# Patient Record
Sex: Female | Born: 1935 | Race: White | Hispanic: No | Smoking: Former smoker
Health system: Southern US, Community
[De-identification: ages and names within clinical notes are randomized; demographics above are authoritative.]

## PROBLEM LIST (undated history)

## (undated) DIAGNOSIS — J8410 Pulmonary fibrosis, unspecified: Secondary | ICD-10-CM

## (undated) DIAGNOSIS — H3530 Unspecified macular degeneration: Secondary | ICD-10-CM

## (undated) DIAGNOSIS — C679 Malignant neoplasm of bladder, unspecified: Secondary | ICD-10-CM

## (undated) DIAGNOSIS — E785 Hyperlipidemia, unspecified: Secondary | ICD-10-CM

## (undated) DIAGNOSIS — I700 Atherosclerosis of aorta: Secondary | ICD-10-CM

## (undated) DIAGNOSIS — E039 Hypothyroidism, unspecified: Secondary | ICD-10-CM

## (undated) DIAGNOSIS — I82B19 Acute embolism and thrombosis of unspecified subclavian vein: Secondary | ICD-10-CM

## (undated) DIAGNOSIS — I739 Peripheral vascular disease, unspecified: Secondary | ICD-10-CM

## (undated) DIAGNOSIS — H548 Legal blindness, as defined in USA: Secondary | ICD-10-CM

## (undated) DIAGNOSIS — R52 Pain, unspecified: Secondary | ICD-10-CM

## (undated) DIAGNOSIS — K5730 Diverticulosis of large intestine without perforation or abscess without bleeding: Secondary | ICD-10-CM

## (undated) HISTORY — DX: Acute embolism and thrombosis of unspecified subclavian vein: I82.B19

## (undated) HISTORY — DX: Unspecified macular degeneration: H35.30

## (undated) HISTORY — DX: Hyperlipidemia, unspecified: E78.5

## (undated) HISTORY — DX: Hypothyroidism, unspecified: E03.9

## (undated) HISTORY — DX: Peripheral vascular disease, unspecified: I73.9

## (undated) HISTORY — PX: BLADDER SURGERY: SHX569

## (undated) HISTORY — DX: Legal blindness, as defined in USA: H54.8

## (undated) HISTORY — DX: Malignant neoplasm of bladder, unspecified: C67.9

## (undated) HISTORY — DX: Atherosclerosis of aorta: I70.0

## (undated) HISTORY — DX: Diverticulosis of large intestine without perforation or abscess without bleeding: K57.30

## (undated) HISTORY — PX: CATARACT EXTRACTION: SUR2

## (undated) HISTORY — PX: TONSILLECTOMY: SUR1361

## (undated) HISTORY — DX: Pulmonary fibrosis, unspecified: J84.10

---

## 1994-05-19 DIAGNOSIS — K5730 Diverticulosis of large intestine without perforation or abscess without bleeding: Secondary | ICD-10-CM

## 1994-05-19 HISTORY — DX: Diverticulosis of large intestine without perforation or abscess without bleeding: K57.30

## 1997-05-19 DIAGNOSIS — I82B19 Acute embolism and thrombosis of unspecified subclavian vein: Secondary | ICD-10-CM

## 1997-05-19 HISTORY — DX: Acute embolism and thrombosis of unspecified subclavian vein: I82.B19

## 1998-03-16 ENCOUNTER — Encounter: Payer: Self-pay | Admitting: Internal Medicine

## 1998-03-16 ENCOUNTER — Ambulatory Visit (HOSPITAL_COMMUNITY): Admission: RE | Admit: 1998-03-16 | Discharge: 1998-03-16 | Payer: Self-pay | Admitting: Internal Medicine

## 1998-03-26 ENCOUNTER — Ambulatory Visit (HOSPITAL_BASED_OUTPATIENT_CLINIC_OR_DEPARTMENT_OTHER): Admission: RE | Admit: 1998-03-26 | Discharge: 1998-03-26 | Payer: Self-pay | Admitting: Urology

## 1998-05-17 ENCOUNTER — Ambulatory Visit: Admission: RE | Admit: 1998-05-17 | Discharge: 1998-05-17 | Payer: Self-pay | Admitting: Cardiovascular Disease

## 1998-05-17 ENCOUNTER — Encounter: Payer: Self-pay | Admitting: Cardiovascular Disease

## 1998-10-17 ENCOUNTER — Ambulatory Visit (HOSPITAL_BASED_OUTPATIENT_CLINIC_OR_DEPARTMENT_OTHER): Admission: RE | Admit: 1998-10-17 | Discharge: 1998-10-17 | Payer: Self-pay | Admitting: Urology

## 2005-04-22 ENCOUNTER — Ambulatory Visit: Payer: Self-pay | Admitting: Family Medicine

## 2006-06-16 ENCOUNTER — Ambulatory Visit: Payer: Self-pay | Admitting: Family Medicine

## 2007-06-29 ENCOUNTER — Ambulatory Visit: Payer: Self-pay | Admitting: Internal Medicine

## 2007-06-29 DIAGNOSIS — K5730 Diverticulosis of large intestine without perforation or abscess without bleeding: Secondary | ICD-10-CM | POA: Insufficient documentation

## 2007-06-29 DIAGNOSIS — F4321 Adjustment disorder with depressed mood: Secondary | ICD-10-CM

## 2007-06-29 DIAGNOSIS — I739 Peripheral vascular disease, unspecified: Secondary | ICD-10-CM

## 2007-06-29 DIAGNOSIS — E039 Hypothyroidism, unspecified: Secondary | ICD-10-CM

## 2007-06-29 DIAGNOSIS — M810 Age-related osteoporosis without current pathological fracture: Secondary | ICD-10-CM | POA: Insufficient documentation

## 2007-06-30 LAB — CONVERTED CEMR LAB
Free T4: 1.1 ng/dL (ref 0.6–1.6)
TSH: 0.54 microintl units/mL (ref 0.35–5.50)

## 2007-09-28 ENCOUNTER — Ambulatory Visit: Payer: Self-pay | Admitting: Internal Medicine

## 2007-12-28 ENCOUNTER — Ambulatory Visit: Payer: Self-pay | Admitting: Internal Medicine

## 2008-10-31 ENCOUNTER — Encounter: Payer: Self-pay | Admitting: Internal Medicine

## 2008-12-05 ENCOUNTER — Ambulatory Visit: Payer: Self-pay | Admitting: Internal Medicine

## 2008-12-07 LAB — CONVERTED CEMR LAB
Albumin: 3.7 g/dL (ref 3.5–5.2)
BUN: 15 mg/dL (ref 6–23)
Basophils Absolute: 0.1 10*3/uL (ref 0.0–0.1)
Basophils Relative: 0.9 % (ref 0.0–3.0)
CO2: 30 meq/L (ref 19–32)
Calcium: 9.8 mg/dL (ref 8.4–10.5)
Chloride: 107 meq/L (ref 96–112)
Creatinine, Ser: 0.8 mg/dL (ref 0.4–1.2)
Eosinophils Absolute: 0.2 10*3/uL (ref 0.0–0.7)
Eosinophils Relative: 3.1 % (ref 0.0–5.0)
Free T4: 1.2 ng/dL (ref 0.6–1.6)
Glucose, Bld: 92 mg/dL (ref 70–99)
HCT: 42.7 % (ref 36.0–46.0)
Hemoglobin: 14.8 g/dL (ref 12.0–15.0)
Lymphocytes Relative: 24.3 % (ref 12.0–46.0)
Lymphs Abs: 1.5 10*3/uL (ref 0.7–4.0)
MCHC: 34.6 g/dL (ref 30.0–36.0)
MCV: 91.9 fL (ref 78.0–100.0)
Monocytes Absolute: 0.6 10*3/uL (ref 0.1–1.0)
Monocytes Relative: 9 % (ref 3.0–12.0)
Neutro Abs: 3.9 10*3/uL (ref 1.4–7.7)
Neutrophils Relative %: 62.7 % (ref 43.0–77.0)
Phosphorus: 3.9 mg/dL (ref 2.3–4.6)
Platelets: 177 10*3/uL (ref 150.0–400.0)
Potassium: 4.9 meq/L (ref 3.5–5.1)
RBC: 4.65 M/uL (ref 3.87–5.11)
RDW: 13.7 % (ref 11.5–14.6)
Sodium: 142 meq/L (ref 135–145)
TSH: 0.78 microintl units/mL (ref 0.35–5.50)
WBC: 6.3 10*3/uL (ref 4.5–10.5)

## 2009-10-30 ENCOUNTER — Encounter: Payer: Self-pay | Admitting: Internal Medicine

## 2009-12-04 ENCOUNTER — Ambulatory Visit: Payer: Self-pay | Admitting: Internal Medicine

## 2009-12-06 LAB — CONVERTED CEMR LAB
Albumin: 3.9 g/dL (ref 3.5–5.2)
BUN: 15 mg/dL (ref 6–23)
Basophils Absolute: 0.1 10*3/uL (ref 0.0–0.1)
Basophils Relative: 0.9 % (ref 0.0–3.0)
CO2: 33 meq/L — ABNORMAL HIGH (ref 19–32)
Calcium: 9.4 mg/dL (ref 8.4–10.5)
Chloride: 105 meq/L (ref 96–112)
Creatinine, Ser: 0.8 mg/dL (ref 0.4–1.2)
Eosinophils Absolute: 0.2 10*3/uL (ref 0.0–0.7)
Eosinophils Relative: 2.4 % (ref 0.0–5.0)
Free T4: 1.04 ng/dL (ref 0.60–1.60)
GFR calc non Af Amer: 74.41 mL/min (ref >60)
Glucose, Bld: 88 mg/dL (ref 70–99)
HCT: 42.3 % (ref 36.0–46.0)
Hemoglobin: 14.4 g/dL (ref 12.0–15.0)
Lymphocytes Relative: 26.1 % (ref 12.0–46.0)
Lymphs Abs: 1.8 10*3/uL (ref 0.7–4.0)
MCHC: 34.1 g/dL (ref 30.0–36.0)
MCV: 92.8 fL (ref 78.0–100.0)
Monocytes Absolute: 0.6 10*3/uL (ref 0.1–1.0)
Monocytes Relative: 8.9 % (ref 3.0–12.0)
Neutro Abs: 4.3 10*3/uL (ref 1.4–7.7)
Neutrophils Relative %: 61.7 % (ref 43.0–77.0)
Phosphorus: 3.4 mg/dL (ref 2.3–4.6)
Platelets: 168 10*3/uL (ref 150.0–400.0)
Potassium: 4.4 meq/L (ref 3.5–5.1)
RBC: 4.55 M/uL (ref 3.87–5.11)
RDW: 14.4 % (ref 11.5–14.6)
Sodium: 142 meq/L (ref 135–145)
TSH: 0.9 microintl units/mL (ref 0.35–5.50)
WBC: 6.9 10*3/uL (ref 4.5–10.5)

## 2010-06-18 NOTE — Letter (Signed)
Summary: [facility]   [facility]   Imported By: Lanelle Bal 11/03/2009 09:36:36  _____________________________________________________________________  External Attachment:    Type:   Image     Comment:   External Document  Appended Document: [facility]  normal cystoscopy no evidence of recurrent bladder tumor

## 2010-06-18 NOTE — Assessment & Plan Note (Signed)
Summary: YEARLY F/U/BIR   Vital Signs:  Patient profile:   77 year old female Height:      63.5 inches Weight:      181 pounds BMI:     31.67 Temp:     98.0 degrees F oral Pulse rate:   64 / minute Pulse rhythm:   regular BP sitting:   112 / 78  (left arm) Cuff size:   large  Vitals Entered By: Sydell Axon LPN (December 04, 2009 10:05 AM) CC: Yearly follow-up  Vision Screening:Left eye w/o correction: 20 / 400 Right Eye w/o correction: 20 / 400        20db HL: Left  500 hz: 25db 1000 hz: 25db 2000 hz: 25db 4000 hz: 25db Right  500 hz: 25db 1000 hz: No Response 2000 hz: 25db 4000 hz: 25db    History of Present Illness: Here for Medicare AWV:  1.   Risk factors based on Past M, S, F history:               No sig medical risk factors               Still prefers to stay isolated for the most part, though living in the [facility] community                2.   Physical Activities:                   Walks about 30 minutes daily for 5 days per week  3.   Depression/mood:                Chronic dysthymia since losing husband and other famiily members over the ast few years               some degree of anhedonia but stable             doesn't feel meds would be appropriate               Generally satisfied with things as they are  4.   Hearing:                  fairly normal  5.   ADL's:                Completely independent with ADLs               Gets Zenaida Niece to grocery store or husband's niece drives her                Cooks, cleans herself 6.   Fall Risk:                Elevated due to vision problems              Balance and strength are good            Home environment as safe as possible after review  7.   Home Safety:              As noted, has standard features for the [facility]               no clutter, etc  8.   Height, weight, &visual acuity:              has gained 5# in past year              tries to eat right and exercise so  no concerns with mild  increased BMI              plans to cut back some---slightly uncomfortable with her current weight             Has macular degeneration and is legally blind             Uses walker to help out of bed and to get her stable when putting on shoes             Apt  set up with safety in mind---standard grab bars, etc in bathroom, no rugs (has carpeting), no               significant clutter   Rest under ROS     Preventive Screening-Counseling & Management  Alcohol-Tobacco     Alcohol drinks/day: 0     Smoking Status: quit > 6 months     Smoking Cessation Counseling: no  Caffeine-Diet-Exercise     Diet Comments: Lots of vegetable and fruit     Diet Counseling: not indicated; diet is assessed to be healthy     Does Patient Exercise: yes     Type of exercise: walks most days     Exercise (avg: min/session): 30-60     Times/week: 5     Exercise Counseling: not indicated; exercise is adequate     Depression Counseling: not indicated; screening negative for depression  Allergies: No Known Drug Allergies  Past History:  Past medical, surgical, family and social histories (including risk factors) reviewed for relevance to current acute and chronic problems.  Past Medical History: Reviewed history from 12/05/2008 and no changes required. Hyperlipidemia Hypothyroidism Osteoporosis Bladder cancer-------Dr Stoioff Diverticulosis, colon---on sig 1996 Peripheral vascular disease Macular degeneration--legally blind  Past Surgical History: Reviewed history from 06/29/2007 and no changes required. 1999/2000/2003  Bladder cancer removed--[facility], then [facility] 1999  Left subclavian obstruction---no Rx----- Dr Alanda Amass Cataracts  Family History: Reviewed history from 06/29/2007 and no changes required. Dad died @87 --didn't really know of what Mom died @84  had enlarged heart 8 siblings--only 1 sister remains DM strong in the family 1 brother died of MI at 31 1 brother died of DM  complications 1 brother died of bladder cancer No HTN No breast or colon cancer  Social History: Reviewed history from 06/29/2007 and no changes required. Widowed 11/08 No children Doesn't drive Former Smoker Alcohol use-no  Has living will. Has DNR form already--wants this continued Requests husband's niece [facility] or Nettie Elm wouldn't want feeding tube Smoking Status:  quit > 6 months Does Patient Exercise:  yes  Review of Systems  The patient denies chest pain, syncope, dyspnea on exertion, abdominal pain, melena, and hematochezia.         9.   Counseling:               discussed mood---no further eval or Rx indicated              Reviewed healthy diet--she generally does well with this             Recommended regular physical activity---at least some walking  10.   Labs ordered based on risk factors:                  Thyroid, glucose mainly  11.           Referral Coordination                none needed  12.           Care Plan                  UTD on immunizations                 has yearly ophtho appts                 Cancer screening not appropriate at her age  49.            Cognitive Assessment                 no cognitive problems   She has no concerns Still only on the thyroid medication, aspirin and citracal weight up 5# sleeps okay but not great Frequent nocturia--often every 2 hours  Physical Exam  General:  alert and normal appearance.   Neck:  supple, no masses, no thyromegaly, no carotid bruits, and no cervical lymphadenopathy.   Lungs:  normal respiratory effort and normal breath sounds.   Heart:  normal rate, regular rhythm, no murmur, and no gallop.   Msk:  no joint tenderness and no joint swelling.   Neurologic:  needs slight assist getting up on table no focal weakness normal tone gait normal Psych:  Oriented X3, memory intact for recent and remote, normally interactive, good eye contact, not anxious appearing, and not  depressed appearing.     Impression & Recommendations:  Problem # 1:  PREVENTIVE HEALTH CARE (ICD-V70.0) Assessment Comment Only  annual wellness visit done dysthymia which is stable and chronic vision loss are main issues Lives in [facility] so has resources should her needs go up  Orders: First annual wellness visit with prevention plan  [phone])  Problem # 2:  HYPOTHYROIDISM (ICD-244.9) Assessment: Comment Only  due for labs no apparent problems  Her updated medication list for this problem includes:    Synthroid 125 Mcg Tabs (Levothyroxine sodium) .Marland Kitchen... Take 1 tablet by mouth once a day  Orders: TLB-TSH (Thyroid Stimulating Hormone) (84443-TSH) TLB-T4 (Thyrox), Free [phone]) TLB-CBC Platelet - w/Differential (85025-CBCD) TLB-Renal Function Panel (80069-RENAL) Venipuncture (65784)  Problem # 3:  OSTEOPOROSIS (ICD-733.00) Assessment: Comment Only walks daily on calcium and vitamin D  Her updated medication list for this problem includes:    Citracal/vitamin D 250-200 Mg-unit Tabs (Calcium citrate-vitamin d) .Marland Kitchen... Take one by mouth two times a day  Complete Medication List: 1)  Synthroid 125 Mcg Tabs (Levothyroxine sodium) .... Take 1 tablet by mouth once a day 2)  Aspirin 81 Mg Tbec (Aspirin) .Marland Kitchen.. 1 daily 3)  Citracal/vitamin D 250-200 Mg-unit Tabs (Calcium citrate-vitamin d) .... Take one by mouth two times a day  Patient Instructions: 1)  Please schedule a follow-up appointment in 1 year.   Current Allergies (reviewed today): No known allergies

## 2010-11-30 ENCOUNTER — Encounter: Payer: Self-pay | Admitting: Internal Medicine

## 2010-12-03 ENCOUNTER — Ambulatory Visit (INDEPENDENT_AMBULATORY_CARE_PROVIDER_SITE_OTHER): Payer: Medicare Other | Admitting: Internal Medicine

## 2010-12-03 ENCOUNTER — Encounter: Payer: Self-pay | Admitting: Internal Medicine

## 2010-12-03 DIAGNOSIS — E039 Hypothyroidism, unspecified: Secondary | ICD-10-CM

## 2010-12-03 DIAGNOSIS — F4321 Adjustment disorder with depressed mood: Secondary | ICD-10-CM

## 2010-12-03 DIAGNOSIS — M810 Age-related osteoporosis without current pathological fracture: Secondary | ICD-10-CM

## 2010-12-03 LAB — HEPATIC FUNCTION PANEL
Alkaline Phosphatase: 76 U/L (ref 39–117)
Bilirubin, Direct: 0.1 mg/dL (ref 0.0–0.3)
Total Protein: 7.4 g/dL (ref 6.0–8.3)

## 2010-12-03 LAB — CBC WITH DIFFERENTIAL/PLATELET
Eosinophils Relative: 2.9 % (ref 0.0–5.0)
HCT: 43.4 % (ref 36.0–46.0)
Hemoglobin: 14.8 g/dL (ref 12.0–15.0)
Lymphocytes Relative: 27 % (ref 12.0–46.0)
MCHC: 34.2 g/dL (ref 30.0–36.0)
MCV: 91.3 fl (ref 78.0–100.0)
Monocytes Absolute: 0.6 10*3/uL (ref 0.1–1.0)
Monocytes Relative: 8.5 % (ref 3.0–12.0)
RBC: 4.75 Mil/uL (ref 3.87–5.11)
RDW: 14 % (ref 11.5–14.6)

## 2010-12-03 LAB — BASIC METABOLIC PANEL
CO2: 28 mEq/L (ref 19–32)
GFR: 69.1 mL/min (ref >60.00)
Glucose, Bld: 108 mg/dL — ABNORMAL HIGH (ref 70–99)
Potassium: 4.5 mEq/L (ref 3.5–5.1)
Sodium: 141 mEq/L (ref 135–145)

## 2010-12-03 NOTE — Assessment & Plan Note (Signed)
Walks regularly Good dietary intake recommmended vitamin D but she prefers not to take any more meds

## 2010-12-03 NOTE — Assessment & Plan Note (Signed)
Chronic melancholy still Keeps herself socially isolated--worsened by her blindness No overt depression though Rx not appropriate

## 2010-12-03 NOTE — Progress Notes (Signed)
Subjective:    Patient ID: Tracy Matthews, female    DOB: 08-10-1921, 77 y.o.   MRN: [id number]  HPI Doing well No new concerns  Still feels lonesome but is used to it Goes to chapel every Sunday Goes to [employer] weekly with [employer] bus to get groceries Doesn't eat out at all--cooks herself  Discussed vitamin D She is reluctant to take any meds Has fair amount of dietary calcium Does try to walk daily and uses exercise bike at home  No sores in feet Some cramps in left foot in past from past fracture Gets toenails trimmed at podiatrist  Still with blindness---just sees shapes, etc Is able to cook, etc with her limited vision  Current Outpatient Prescriptions on File Prior to Visit  Medication Sig Dispense Refill  . aspirin 81 MG tablet Take 81 mg by mouth daily.        Marland Kitchen levothyroxine (SYNTHROID, LEVOTHROID) 125 MCG tablet Take 125 mcg by mouth daily.          No Known Allergies  Past Medical History  Diagnosis Date  . Hyperlipidemia   . Osteoporosis   . Hyperthyroidism   . Bladder cancer     Dr Lonna Cobb  . Diverticulosis of colon 1996    on sig  . PVD (peripheral vascular disease)   . Macular degeneration     legally blind  . Subclavian vein obstruction 1999    Left, no Rx, Dr Alanda Amass    Past Surgical History  Procedure Date  . Bladder surgery [phone]    bladder cancer removed- [facility], then [facility]  . Cataract extraction     Family History  Problem Relation Age of Onset  . Other Mother     enlarged heart  . Diabetes      strong in family  . Heart attack Brother 37  . Diabetes Brother   . Cancer Brother     bladder ca  . Hypertension Neg Hx   . Breast cancer Neg Hx   . Colon cancer Neg Hx     History   Social History  . Marital Status: Married    Spouse Name: N/A    Number of Children: 0  . Years of Education: N/A   Occupational History  . Not on file.   Social History Main Topics  . Smoking status: Former Games developer  .  Smokeless tobacco: Never Used  . Alcohol Use: No  . Drug Use: Not on file  . Sexually Active: Not on file   Other Topics Concern  . Not on file   Social History Narrative   8 siblings, only 1 sister remainsWidowed 11/08No childrenDoesn't driveHas living will. Has DNR form already- wants this continued. Request husbands niece [facility] or Nettie Elm wouldn't want feeding tube   Review of Systems Sleeps okay--but is awake a lot. Not tired during the day Frequent nocturia Weight is stable    Objective:   Physical Exam  Constitutional: She appears well-developed and well-nourished. No distress.  Neck: Normal range of motion. Neck supple. No thyromegaly present.  Cardiovascular: Normal rate, regular rhythm and normal heart sounds.  Exam reveals no gallop.   No murmur heard.      Faint distal pulses  Pulmonary/Chest: Effort normal and breath sounds normal. No respiratory distress. She has no wheezes. She has no rales.  Abdominal: Soft. She exhibits no mass. There is no tenderness.  Musculoskeletal: Normal range of motion. She exhibits no edema and no tenderness.  Lymphadenopathy:  She has no cervical adenopathy.  Psychiatric: She has a normal mood and affect. Her behavior is normal. Judgment and thought content normal.          Assessment & Plan:

## 2010-12-03 NOTE — Assessment & Plan Note (Signed)
Seems to be euthyroid Will check labs 

## 2010-12-10 ENCOUNTER — Encounter: Payer: Self-pay | Admitting: *Deleted

## 2011-04-11 ENCOUNTER — Other Ambulatory Visit: Payer: Self-pay | Admitting: Internal Medicine

## 2011-07-22 DIAGNOSIS — B351 Tinea unguium: Secondary | ICD-10-CM | POA: Diagnosis not present

## 2011-07-22 DIAGNOSIS — M79609 Pain in unspecified limb: Secondary | ICD-10-CM | POA: Diagnosis not present

## 2011-10-21 DIAGNOSIS — B351 Tinea unguium: Secondary | ICD-10-CM | POA: Diagnosis not present

## 2011-10-21 DIAGNOSIS — M79609 Pain in unspecified limb: Secondary | ICD-10-CM | POA: Diagnosis not present

## 2011-10-28 DIAGNOSIS — Z8551 Personal history of malignant neoplasm of bladder: Secondary | ICD-10-CM | POA: Diagnosis not present

## 2011-12-09 ENCOUNTER — Ambulatory Visit: Payer: Medicare Other | Admitting: Internal Medicine

## 2011-12-15 ENCOUNTER — Ambulatory Visit: Payer: Medicare Other | Admitting: Internal Medicine

## 2011-12-16 ENCOUNTER — Encounter: Payer: Self-pay | Admitting: Internal Medicine

## 2011-12-16 ENCOUNTER — Ambulatory Visit (INDEPENDENT_AMBULATORY_CARE_PROVIDER_SITE_OTHER): Payer: Medicare Other | Admitting: Internal Medicine

## 2011-12-16 VITALS — BP 142/84 | HR 76 | Temp 97.9°F | Ht 63.5 in | Wt 171.5 lb

## 2011-12-16 DIAGNOSIS — H548 Legal blindness, as defined in USA: Secondary | ICD-10-CM

## 2011-12-16 DIAGNOSIS — E039 Hypothyroidism, unspecified: Secondary | ICD-10-CM | POA: Diagnosis not present

## 2011-12-16 DIAGNOSIS — M810 Age-related osteoporosis without current pathological fracture: Secondary | ICD-10-CM

## 2011-12-16 LAB — BASIC METABOLIC PANEL
BUN: 13 mg/dL (ref 6–23)
CO2: 29 mEq/L (ref 19–32)
Chloride: 103 mEq/L (ref 96–112)
GFR: 74.06 mL/min (ref >60.00)
Glucose, Bld: 95 mg/dL (ref 70–99)
Potassium: 4.7 mEq/L (ref 3.5–5.1)

## 2011-12-16 LAB — CBC WITH DIFFERENTIAL/PLATELET
Basophils Absolute: 0 10*3/uL (ref 0.0–0.1)
Eosinophils Absolute: 0.2 10*3/uL (ref 0.0–0.7)
HCT: 43 % (ref 36.0–46.0)
Lymphs Abs: 1.6 10*3/uL (ref 0.7–4.0)
MCHC: 33.5 g/dL (ref 30.0–36.0)
MCV: 91.2 fl (ref 78.0–100.0)
Monocytes Absolute: 0.7 10*3/uL (ref 0.1–1.0)
Platelets: 160 10*3/uL (ref 150.0–400.0)
RDW: 14.3 % (ref 11.5–14.6)

## 2011-12-16 LAB — HEPATIC FUNCTION PANEL
Albumin: 3.8 g/dL (ref 3.5–5.2)
Total Protein: 6.6 g/dL (ref 6.0–8.3)

## 2011-12-16 LAB — TSH: TSH: 1.06 u[IU]/mL (ref 0.35–5.50)

## 2011-12-16 NOTE — Progress Notes (Signed)
Subjective:    Patient ID: Tracy Matthews, female    DOB: 09-11-1921, 78 y.o.   MRN: [id number]  HPI Doing well Here for check up No new concerns  Still lives independently Goes [place] grocery store [place] bus Sees enough to shop and cook herself Independent with all cleaning and cooking Prefers to stay by herself---doesn't go to activities at [facility]  Still on thyroid med Bowels fine Skin feels somewhat bumpy---no sig dry or flaky skin Notes dry mouth--uses biotene  Gets dietary calcium Still prefers not to take vitamin d  Not overtly depressed but isolated  Walks daily Some degree of anhedonia  Current Outpatient Prescriptions on File Prior to Visit  Medication Sig Dispense Refill  . aspirin 81 MG tablet Take 81 mg by mouth daily.        Marland Kitchen SYNTHROID 125 MCG tablet TAKE 1 TABLET EVERY DAY  30 tablet  6    No Known Allergies  Past Medical History  Diagnosis Date  . Hyperlipidemia   . Osteoporosis   . Bladder cancer     Dr Lonna Cobb  . Diverticulosis of colon 1996    on sig  . PVD (peripheral vascular disease)   . Macular degeneration     legally blind  . Subclavian vein obstruction 1999    Left, no Rx, Dr Alanda Amass  . Unspecified hypothyroidism     Past Surgical History  Procedure Date  . Bladder surgery [phone]    bladder cancer removed- [facility], then [facility]  . Cataract extraction     Family History  Problem Relation Age of Onset  . Other Mother     enlarged heart  . Diabetes      strong in family  . Heart attack Brother 37  . Diabetes Brother   . Cancer Brother     bladder ca  . Hypertension Neg Hx   . Breast cancer Neg Hx   . Colon cancer Neg Hx     History   Social History  . Marital Status: Widowed    Spouse Name: N/A    Number of Children: 0  . Years of Education: N/A   Occupational History  . Not on file.   Social History Main Topics  . Smoking status: Former Games developer  . Smokeless tobacco: Never Used  . Alcohol  Use: No  . Drug Use: No  . Sexually Active: Not on file   Other Topics Concern  . Not on file   Social History Narrative   8 siblings, only 1 sister remainsWidowed 11/08No childrenDoesn't driveHas living will. Has DNR form already- wants this continued. Request husbands niece [facility] or Nettie Elm wouldn't want feeding tube   Review of Systems Appetite is fine Weight stable Sleep is variable--gets bored and will be up at times    Objective:   Physical Exam  Constitutional: She appears well-developed and well-nourished. No distress.  Neck: Normal range of motion. Neck supple. No thyromegaly present.  Cardiovascular: Normal rate, regular rhythm and normal heart sounds.  Exam reveals no gallop.   No murmur heard. Pulmonary/Chest: Effort normal and breath sounds normal. No respiratory distress. She has no wheezes. She has no rales.  Abdominal: Soft. There is no tenderness.  Musculoskeletal: She exhibits no edema and no tenderness.  Lymphadenopathy:    She has no cervical adenopathy.  Psychiatric: Her behavior is normal.       Sedate but not depressed Appropriate affect  Assessment & Plan:

## 2011-12-16 NOTE — Assessment & Plan Note (Signed)
Seems to be euthyroid Will check labs 

## 2011-12-16 NOTE — Assessment & Plan Note (Signed)
Limited but still able to live independently Prefers to stay by herself and eschews activities---this is the way she wants it

## 2011-12-16 NOTE — Assessment & Plan Note (Signed)
walks daily Calcium in diet Prefers not to take vitamin D or any other meds

## 2011-12-19 ENCOUNTER — Other Ambulatory Visit: Payer: Self-pay | Admitting: Internal Medicine

## 2012-01-20 DIAGNOSIS — M79609 Pain in unspecified limb: Secondary | ICD-10-CM | POA: Diagnosis not present

## 2012-01-20 DIAGNOSIS — B351 Tinea unguium: Secondary | ICD-10-CM | POA: Diagnosis not present

## 2012-02-24 DIAGNOSIS — H3530 Unspecified macular degeneration: Secondary | ICD-10-CM | POA: Diagnosis not present

## 2012-03-16 DIAGNOSIS — Z23 Encounter for immunization: Secondary | ICD-10-CM | POA: Diagnosis not present

## 2012-04-27 DIAGNOSIS — M79609 Pain in unspecified limb: Secondary | ICD-10-CM | POA: Diagnosis not present

## 2012-04-27 DIAGNOSIS — B351 Tinea unguium: Secondary | ICD-10-CM | POA: Diagnosis not present

## 2012-05-07 ENCOUNTER — Other Ambulatory Visit: Payer: Self-pay | Admitting: Internal Medicine

## 2012-07-26 DIAGNOSIS — B351 Tinea unguium: Secondary | ICD-10-CM | POA: Diagnosis not present

## 2012-07-26 DIAGNOSIS — M79609 Pain in unspecified limb: Secondary | ICD-10-CM | POA: Diagnosis not present

## 2012-10-25 DIAGNOSIS — M79609 Pain in unspecified limb: Secondary | ICD-10-CM | POA: Diagnosis not present

## 2012-10-25 DIAGNOSIS — B351 Tinea unguium: Secondary | ICD-10-CM | POA: Diagnosis not present

## 2012-12-17 ENCOUNTER — Ambulatory Visit: Payer: Medicare Other | Admitting: Internal Medicine

## 2012-12-21 ENCOUNTER — Ambulatory Visit: Payer: Medicare Other | Admitting: Internal Medicine

## 2012-12-28 ENCOUNTER — Encounter: Payer: Self-pay | Admitting: Internal Medicine

## 2012-12-28 ENCOUNTER — Ambulatory Visit (INDEPENDENT_AMBULATORY_CARE_PROVIDER_SITE_OTHER): Payer: Medicare Other | Admitting: Internal Medicine

## 2012-12-28 VITALS — BP 120/80 | HR 54 | Temp 97.7°F | Wt 170.0 lb

## 2012-12-28 DIAGNOSIS — H548 Legal blindness, as defined in USA: Secondary | ICD-10-CM

## 2012-12-28 DIAGNOSIS — E039 Hypothyroidism, unspecified: Secondary | ICD-10-CM

## 2012-12-28 DIAGNOSIS — M810 Age-related osteoporosis without current pathological fracture: Secondary | ICD-10-CM

## 2012-12-28 LAB — HEPATIC FUNCTION PANEL
ALT: 15 U/L (ref 0–35)
Albumin: 3.9 g/dL (ref 3.5–5.2)
Alkaline Phosphatase: 69 U/L (ref 39–117)
Total Protein: 7.3 g/dL (ref 6.0–8.3)

## 2012-12-28 LAB — CBC WITH DIFFERENTIAL/PLATELET
Basophils Absolute: 0 10*3/uL (ref 0.0–0.1)
Basophils Relative: 0.3 % (ref 0.0–3.0)
Eosinophils Absolute: 0.1 10*3/uL (ref 0.0–0.7)
HCT: 45.4 % (ref 36.0–46.0)
Hemoglobin: 15.1 g/dL — ABNORMAL HIGH (ref 12.0–15.0)
Lymphs Abs: 1.7 10*3/uL (ref 0.7–4.0)
MCHC: 33.3 g/dL (ref 30.0–36.0)
MCV: 92.2 fl (ref 78.0–100.0)
Neutro Abs: 5 10*3/uL (ref 1.4–7.7)
RDW: 14.6 % (ref 11.5–14.6)

## 2012-12-28 LAB — BASIC METABOLIC PANEL
CO2: 28 mEq/L (ref 19–32)
Chloride: 101 mEq/L (ref 96–112)
Glucose, Bld: 98 mg/dL (ref 70–99)
Potassium: 5.1 mEq/L (ref 3.5–5.1)
Sodium: 138 mEq/L (ref 135–145)

## 2012-12-28 MED ORDER — LEVOTHYROXINE SODIUM 125 MCG PO TABS: 125.0000 ug | ORAL_TABLET | Freq: Every day | ORAL | Status: DC

## 2012-12-28 NOTE — Progress Notes (Signed)
Subjective:    Patient ID: Tracy Matthews, female    DOB: February 21, 1922, 79 y.o.   MRN: [id number]  HPI Doing fairly well Still independent at [facility] Limited activities due to blindness Shops at [place] --gets ride in bus Does all her cooking and cleaning Still prefers to stay isolated in unit  Still walks daily--about a mile inside (lives in B building) No chest pain--- but may have slight tight sensation No sig change in stamina No SOB  Still on thyroid Wants to consider generic No sig changes in hair, skin or nails  Current Outpatient Prescriptions on File Prior to Visit  Medication Sig Dispense Refill  . aspirin 81 MG tablet Take 81 mg by mouth daily.        Marland Kitchen SYNTHROID 125 MCG tablet TAKE 1 TABLET EVERY DAY  30 tablet  11   No current facility-administered medications on file prior to visit.    No Known Allergies  Past Medical History  Diagnosis Date  . Hyperlipidemia   . Osteoporosis   . Bladder cancer     Dr Lonna Cobb  . Diverticulosis of colon 1996    on sig  . PVD (peripheral vascular disease)   . Macular degeneration     legally blind  . Subclavian vein obstruction 1999    Left, no Rx, Dr Alanda Amass  . Unspecified hypothyroidism   . Legal blindness     Past Surgical History  Procedure Laterality Date  . Bladder surgery  [phone]    bladder cancer removed- [facility], then [facility]  . Cataract extraction      Family History  Problem Relation Age of Onset  . Other Mother     enlarged heart  . Diabetes      strong in family  . Heart attack Brother 37  . Diabetes Brother   . Cancer Brother     bladder ca  . Hypertension Neg Hx   . Breast cancer Neg Hx   . Colon cancer Neg Hx     History   Social History  . Marital Status: Widowed    Spouse Name: N/A    Number of Children: 0  . Years of Education: N/A   Occupational History  . Not on file.   Social History Main Topics  . Smoking status: Former Games developer  . Smokeless tobacco:  Never Used  . Alcohol Use: No  . Drug Use: No  . Sexually Active: Not on file   Other Topics Concern  . Not on file   Social History Narrative   8 siblings, only 1 sister remains   Widowed 11/08   No children   Doesn't drive   Has living will. Has DNR form already- wants this continued. Request husbands niece [facility] or Nettie Elm wouldn't want feeding tube   Review of Systems Discussed vitamin D--- drinks milk. She states she will get more sun exposure (doesn't want any more meds) Sleep is okay--but is up at night at times Appetite is fine Weight is stable    Objective:   Physical Exam  Constitutional: She appears well-developed and well-nourished. No distress.  Neck: Normal range of motion. Neck supple. No thyromegaly present.  Cardiovascular: Normal rate, regular rhythm and normal heart sounds.  Exam reveals no gallop.   No murmur heard. Pulmonary/Chest: Effort normal and breath sounds normal. No respiratory distress. She has no wheezes. She has no rales.  Abdominal: Soft. There is no tenderness.  Musculoskeletal: She exhibits no edema and no tenderness.  Lymphadenopathy:    She has no cervical adenopathy.  Psychiatric: She has a normal mood and affect. Her behavior is normal.          Assessment & Plan:

## 2012-12-28 NOTE — Assessment & Plan Note (Signed)
She walks daily Drinks a lot of milk Unwilling to take vitamin D supplements

## 2012-12-28 NOTE — Assessment & Plan Note (Signed)
Limits her activities She prefers to be alone Some mood issues but no MDD satisfied with her life

## 2012-12-28 NOTE — Assessment & Plan Note (Signed)
Seems to be euthyroid Will check labs Change to generic

## 2013-02-07 DIAGNOSIS — B351 Tinea unguium: Secondary | ICD-10-CM | POA: Diagnosis not present

## 2013-02-07 DIAGNOSIS — M79609 Pain in unspecified limb: Secondary | ICD-10-CM | POA: Diagnosis not present

## 2013-03-17 DIAGNOSIS — Z23 Encounter for immunization: Secondary | ICD-10-CM | POA: Diagnosis not present

## 2013-05-09 ENCOUNTER — Ambulatory Visit (INDEPENDENT_AMBULATORY_CARE_PROVIDER_SITE_OTHER): Payer: Medicare Other | Admitting: Podiatry

## 2013-05-09 ENCOUNTER — Encounter: Payer: Self-pay | Admitting: Podiatry

## 2013-05-09 VITALS — BP 121/54 | HR 80 | Resp 16 | Ht 63.0 in | Wt 170.0 lb

## 2013-05-09 DIAGNOSIS — B351 Tinea unguium: Secondary | ICD-10-CM

## 2013-05-09 DIAGNOSIS — M79609 Pain in unspecified limb: Secondary | ICD-10-CM

## 2013-05-09 NOTE — Progress Notes (Signed)
She presents today chief complaint of painful toenails bilateral.  Objective: Pulses are palpable bilateral. Nails are thick yellow dystrophic clinically mycotic.  Assessment: Pain in limb secondary to onychomycosis 1 through 5 bilateral.  Plan: Debridement of nails 1 through 5 bilateral is cover service secondary to pain followup with me as needed.

## 2013-08-08 ENCOUNTER — Ambulatory Visit (INDEPENDENT_AMBULATORY_CARE_PROVIDER_SITE_OTHER): Payer: Medicare Other | Admitting: Podiatry

## 2013-08-08 VITALS — BP 148/68 | HR 68 | Resp 16 | Ht 63.0 in | Wt 170.0 lb

## 2013-08-08 DIAGNOSIS — B351 Tinea unguium: Secondary | ICD-10-CM | POA: Diagnosis not present

## 2013-08-08 DIAGNOSIS — M79609 Pain in unspecified limb: Secondary | ICD-10-CM

## 2013-08-08 NOTE — Progress Notes (Signed)
She presents today with a chief complaint of painful E. elongated toenails.  Objective: Vital signs are stable she is alert and oriented x3. Her nails are thick yellow dystrophic lytic mycotic.  Assessment: Pain in limb associated with onychomycosis 1 through 5 bilateral toenails.  Plan: Debridement of nails 1 through 5 bilateral.

## 2013-11-07 ENCOUNTER — Ambulatory Visit (INDEPENDENT_AMBULATORY_CARE_PROVIDER_SITE_OTHER): Payer: Medicare Other | Admitting: Podiatry

## 2013-11-07 VITALS — BP 115/59 | HR 68 | Resp 16

## 2013-11-07 DIAGNOSIS — M79609 Pain in unspecified limb: Secondary | ICD-10-CM

## 2013-11-07 DIAGNOSIS — B351 Tinea unguium: Secondary | ICD-10-CM

## 2013-11-07 DIAGNOSIS — M79673 Pain in unspecified foot: Secondary | ICD-10-CM

## 2013-11-07 NOTE — Progress Notes (Signed)
She presents today with a chief complaint of painful elongated toenails.  Objective: Pulses are palpable bilateral. Nails are thick yellow dystrophic with mycotic and painful palpation.  Assessment: Pain in limb secondary to onychomycosis 1 through 5 bilateral.  Plan: Debridement of nails 1 through 5 bilateral covered service secondary to pain.

## 2014-01-03 ENCOUNTER — Ambulatory Visit (INDEPENDENT_AMBULATORY_CARE_PROVIDER_SITE_OTHER): Payer: Medicare Other | Admitting: Internal Medicine

## 2014-01-03 ENCOUNTER — Encounter: Payer: Self-pay | Admitting: Internal Medicine

## 2014-01-03 VITALS — BP 132/70 | HR 63 | Temp 97.0°F | Ht 63.66 in | Wt 164.0 lb

## 2014-01-03 DIAGNOSIS — M810 Age-related osteoporosis without current pathological fracture: Secondary | ICD-10-CM

## 2014-01-03 DIAGNOSIS — E039 Hypothyroidism, unspecified: Secondary | ICD-10-CM | POA: Diagnosis not present

## 2014-01-03 DIAGNOSIS — I739 Peripheral vascular disease, unspecified: Secondary | ICD-10-CM

## 2014-01-03 DIAGNOSIS — M170 Bilateral primary osteoarthritis of knee: Secondary | ICD-10-CM | POA: Insufficient documentation

## 2014-01-03 DIAGNOSIS — H548 Legal blindness, as defined in USA: Secondary | ICD-10-CM

## 2014-01-03 DIAGNOSIS — Z23 Encounter for immunization: Secondary | ICD-10-CM

## 2014-01-03 DIAGNOSIS — M1710 Unilateral primary osteoarthritis, unspecified knee: Secondary | ICD-10-CM

## 2014-01-03 LAB — COMPREHENSIVE METABOLIC PANEL
ALT: 13 U/L (ref 0–35)
AST: 18 U/L (ref 0–37)
Albumin: 3.8 g/dL (ref 3.5–5.2)
Alkaline Phosphatase: 73 U/L (ref 39–117)
BUN: 15 mg/dL (ref 6–23)
CALCIUM: 9.2 mg/dL (ref 8.4–10.5)
CHLORIDE: 102 meq/L (ref 96–112)
CO2: 29 meq/L (ref 19–32)
CREATININE: 0.9 mg/dL (ref 0.4–1.2)
GFR: 60.17 mL/min (ref >60.00)
GLUCOSE: 92 mg/dL (ref 70–99)
Potassium: 4.3 mEq/L (ref 3.5–5.1)
Sodium: 139 mEq/L (ref 135–145)
TOTAL PROTEIN: 7.3 g/dL (ref 6.0–8.3)
Total Bilirubin: 1 mg/dL (ref 0.2–1.2)

## 2014-01-03 LAB — CBC WITH DIFFERENTIAL/PLATELET
BASOS ABS: 0 10*3/uL (ref 0.0–0.1)
Basophils Relative: 0.4 % (ref 0.0–3.0)
Eosinophils Absolute: 0.2 10*3/uL (ref 0.0–0.7)
Eosinophils Relative: 2.2 % (ref 0.0–5.0)
HCT: 44 % (ref 36.0–46.0)
HEMOGLOBIN: 14.8 g/dL (ref 12.0–15.0)
LYMPHS PCT: 24.7 % (ref 12.0–46.0)
Lymphs Abs: 1.7 10*3/uL (ref 0.7–4.0)
MCHC: 33.7 g/dL (ref 30.0–36.0)
MCV: 91.6 fl (ref 78.0–100.0)
MONOS PCT: 8.2 % (ref 3.0–12.0)
Monocytes Absolute: 0.6 10*3/uL (ref 0.1–1.0)
Neutro Abs: 4.3 10*3/uL (ref 1.4–7.7)
Neutrophils Relative %: 64.5 % (ref 43.0–77.0)
PLATELETS: 177 10*3/uL (ref 150.0–400.0)
RBC: 4.8 Mil/uL (ref 3.87–5.11)
RDW: 14.5 % (ref 11.5–15.5)
WBC: 6.7 10*3/uL (ref 4.0–10.5)

## 2014-01-03 LAB — T4, FREE: Free T4: 1.4 ng/dL (ref 0.60–1.60)

## 2014-01-03 LAB — TSH: TSH: 0.18 u[IU]/mL — AB (ref 0.35–4.50)

## 2014-01-03 NOTE — Assessment & Plan Note (Signed)
Prefers not to take vitamin D despite my recommendation Does walk regularly and drinks milk

## 2014-01-03 NOTE — Addendum Note (Signed)
Addended by: Lurlean Nanny on: 01/03/2014 11:17 AM   Modules accepted: Orders

## 2014-01-03 NOTE — Progress Notes (Signed)
Pre visit review using our clinic review tool, if applicable. No additional management support is needed unless otherwise documented below in the visit note. 

## 2014-01-03 NOTE — Assessment & Plan Note (Signed)
Mild Prefers no meds---recommended tylenol if it worsens

## 2014-01-03 NOTE — Assessment & Plan Note (Signed)
Seems to be euthyroid Will recheck labs

## 2014-01-03 NOTE — Assessment & Plan Note (Signed)
Able to maintain her independence Uses magnifying glass with light to help (like picking out food in [place])

## 2014-01-03 NOTE — Progress Notes (Signed)
Subjective:    Patient ID: Tracy Matthews, female    DOB: 04-28-1922, 80 y.o.   MRN: [id number]  HPI Doing okay Remains down to some degree--- everyone is gone Prefers to stay to herself at home Goes to [place] for shopping on [address] [facility] her own meals--does all housework Walks regularly on campus  Still on the thyroid med No changes in skin, hair or nails Does have typical hair shedding Has lost 6#--- happy about that---being careful with eating  Discussed vitamin D She doesn't want to take this She does drink milk reguarly  Legally blind Able to get along functionally  Has some pain in knees Not really limiting Prefers not to take medications for this  Current Outpatient Prescriptions on File Prior to Visit  Medication Sig Dispense Refill  . aspirin 81 MG tablet Take 81 mg by mouth daily.        Marland Kitchen levothyroxine (SYNTHROID) 125 MCG tablet Take 1 tablet (125 mcg total) by mouth daily before breakfast.  90 tablet  3   No current facility-administered medications on file prior to visit.    No Known Allergies  Past Medical History  Diagnosis Date  . Hyperlipidemia   . Osteoporosis   . Bladder cancer     Dr Bernardo Heater  . Diverticulosis of colon 1996    on sig  . PVD (peripheral vascular disease)   . Macular degeneration     legally blind  . Subclavian vein obstruction 1999    Left, no Rx, Dr Rollene Fare  . Unspecified hypothyroidism   . Legal blindness     Past Surgical History  Procedure Laterality Date  . Bladder surgery  [phone]    bladder cancer removed- [facility], then [facility]  . Cataract extraction      Family History  Problem Relation Age of Onset  . Other Mother     enlarged heart  . Diabetes      strong in family  . Heart attack Brother 60  . Diabetes Brother   . Cancer Brother     bladder ca  . Hypertension Neg Hx   . Breast cancer Neg Hx   . Colon cancer Neg Hx     History   Social History  . Marital Status: Widowed   Spouse Name: N/A    Number of Children: 0  . Years of Education: N/A   Occupational History  . Not on file.   Social History Main Topics  . Smoking status: Former Research scientist (life sciences)  . Smokeless tobacco: Never Used  . Alcohol Use: No  . Drug Use: No  . Sexual Activity: Not on file   Other Topics Concern  . Not on file   Social History Narrative   Widowed 11/08   No children   Doesn't drive   Has living will. Has DNR form already- wants this continued.    Request husbands niece [facility] or [place] as health care POA   Wouldn't want feeding tube   Review of Systems Appetite is okay Sleeps okay Bowels are okay--uses a small amount of MOM at night to stay regular Worries about urinary incontinence (urge)--- wears pad when out, and at night     Objective:   Physical Exam  Constitutional: She appears well-developed and well-nourished. No distress.  Neck: Normal range of motion. Neck supple. No thyromegaly present.  Cardiovascular: Normal rate, regular rhythm and normal heart sounds.  Exam reveals no gallop.   No murmur heard. Pulmonary/Chest: Effort normal and  breath sounds normal. No respiratory distress. She has no wheezes. She has no rales.  Abdominal: Soft. She exhibits no distension. There is no tenderness. There is no rebound.  Musculoskeletal: She exhibits no edema and no tenderness.  Knees thickened without effusion Mild crepitus with ROM  Lymphadenopathy:    She has no cervical adenopathy.  Skin: No rash noted.  Psychiatric: She has a normal mood and affect. Her behavior is normal.          Assessment & Plan:

## 2014-01-03 NOTE — Assessment & Plan Note (Signed)
No claudication at this point No further intervention or testing

## 2014-01-05 ENCOUNTER — Encounter: Payer: Self-pay | Admitting: *Deleted

## 2014-01-13 ENCOUNTER — Other Ambulatory Visit: Payer: Self-pay | Admitting: Internal Medicine

## 2014-02-06 ENCOUNTER — Ambulatory Visit (INDEPENDENT_AMBULATORY_CARE_PROVIDER_SITE_OTHER): Payer: Medicare Other | Admitting: Podiatry

## 2014-02-06 DIAGNOSIS — M79609 Pain in unspecified limb: Secondary | ICD-10-CM

## 2014-02-06 DIAGNOSIS — M79673 Pain in unspecified foot: Secondary | ICD-10-CM

## 2014-02-06 DIAGNOSIS — B351 Tinea unguium: Secondary | ICD-10-CM

## 2014-02-06 DIAGNOSIS — M79676 Pain in unspecified toe(s): Secondary | ICD-10-CM

## 2014-02-06 NOTE — Progress Notes (Signed)
She presents today with a chief complaint of painful elongated toenails.  Objective: Nails are thick yellow dystrophic with mycotic and painful palpation.  Assessment: Pain in limb secondary to onychomycosis 1 through 5 bilateral.  Plan: Debridement of nails 1 through 5 bilateral covered service secondary to pain 

## 2014-03-09 DIAGNOSIS — Z23 Encounter for immunization: Secondary | ICD-10-CM | POA: Diagnosis not present

## 2014-03-14 DIAGNOSIS — Z961 Presence of intraocular lens: Secondary | ICD-10-CM | POA: Diagnosis not present

## 2014-05-01 ENCOUNTER — Ambulatory Visit (INDEPENDENT_AMBULATORY_CARE_PROVIDER_SITE_OTHER): Payer: Medicare Other | Admitting: Podiatry

## 2014-05-01 DIAGNOSIS — M79676 Pain in unspecified toe(s): Secondary | ICD-10-CM | POA: Diagnosis not present

## 2014-05-01 DIAGNOSIS — B351 Tinea unguium: Secondary | ICD-10-CM | POA: Diagnosis not present

## 2014-05-01 NOTE — Progress Notes (Signed)
She presents today with a chief complaint of painful elongated toenails.  Objective: Nails are thick yellow dystrophic with mycotic and painful palpation.  Assessment: Pain in limb secondary to onychomycosis 1 through 5 bilateral.  Plan: Debridement of nails 1 through 5 bilateral covered service secondary to pain 

## 2014-05-08 ENCOUNTER — Ambulatory Visit: Payer: Medicare Other | Admitting: Podiatry

## 2014-07-31 ENCOUNTER — Ambulatory Visit (INDEPENDENT_AMBULATORY_CARE_PROVIDER_SITE_OTHER): Payer: Medicare Other | Admitting: Podiatry

## 2014-07-31 ENCOUNTER — Other Ambulatory Visit: Payer: Medicare Other

## 2014-07-31 DIAGNOSIS — M79676 Pain in unspecified toe(s): Secondary | ICD-10-CM

## 2014-07-31 DIAGNOSIS — B351 Tinea unguium: Secondary | ICD-10-CM

## 2014-07-31 NOTE — Progress Notes (Signed)
Presents today chief complaint of painful elongated toenails.  Objective: Pulses are palpable bilateral nails are thick, yellow dystrophic onychomycosis and painful palpation.   Assessment: Onychomycosis with pain in limb.  Plan: Treatment of nails in thickness and length as covered service secondary to pain.  

## 2014-08-21 ENCOUNTER — Encounter: Payer: Self-pay | Admitting: Family Medicine

## 2014-08-21 ENCOUNTER — Ambulatory Visit (INDEPENDENT_AMBULATORY_CARE_PROVIDER_SITE_OTHER): Payer: Medicare Other | Admitting: Family Medicine

## 2014-08-21 VITALS — BP 150/60 | HR 80 | Temp 98.2°F | Wt 159.5 lb

## 2014-08-21 DIAGNOSIS — M549 Dorsalgia, unspecified: Secondary | ICD-10-CM | POA: Insufficient documentation

## 2014-08-21 DIAGNOSIS — M545 Low back pain: Secondary | ICD-10-CM | POA: Diagnosis not present

## 2014-08-21 MED ORDER — IBUPROFEN 200 MG PO TABS: 200.0000 mg | ORAL_TABLET | Freq: Three times a day (TID) | ORAL | Status: DC | PRN

## 2014-08-21 NOTE — Patient Instructions (Signed)
Take 1-2 OTC ibuprofen up to three times a day if needed.  Try ice and/or heat for your lower back.  This should gradually improve.  Take care.

## 2014-08-21 NOTE — Assessment & Plan Note (Signed)
Likely a MSK strain, after straining for BM.  Now with normal BMs.   Take 1-2 OTC ibuprofen up to three times a day if needed with food.   Try ice and/or heat for lower back, d/w pt.   F/u prn.  Should improve in the near future.

## 2014-08-21 NOTE — Progress Notes (Signed)
Back pain.  No blood noted urine.  Back pain started about 3 days ago.  "I got impacted and I tried to take an enema."  She was straining and then had back pain.  The pain "didn't all happen at once."  She was able to get her bowels moving in the meantime.   B lower back pain.  Some L leg pain today, started when trying to put on shoes.  No burning with urination.  No pain with BM now.  Pain getting up, but does better once she is up.  Using walker today, and that is atypical for her.  She still has normal sensation in the BLE.  No falls.   Meds, vitals, and allergies reviewed.   ROS: See HPI.  Otherwise, noncontributory.  nad ncat rrr ctab abd soft, not ttp Back w/o midline pain.  Diffuse muscle tightness in the B lower back w/o bruising.  SLR neg L thigh not ttp Walking with a walker.

## 2014-08-28 ENCOUNTER — Encounter: Payer: Self-pay | Admitting: Internal Medicine

## 2014-08-28 ENCOUNTER — Ambulatory Visit (INDEPENDENT_AMBULATORY_CARE_PROVIDER_SITE_OTHER): Payer: Medicare Other | Admitting: Internal Medicine

## 2014-08-28 VITALS — BP 140/74 | HR 80 | Temp 98.0°F | Wt 157.5 lb

## 2014-08-28 DIAGNOSIS — K5900 Constipation, unspecified: Secondary | ICD-10-CM | POA: Insufficient documentation

## 2014-08-28 DIAGNOSIS — M545 Low back pain, unspecified: Secondary | ICD-10-CM

## 2014-08-28 MED ORDER — TRAMADOL HCL 50 MG PO TABS: 25.0000 mg | ORAL_TABLET | Freq: Three times a day (TID) | ORAL | Status: DC | PRN

## 2014-08-28 NOTE — Assessment & Plan Note (Signed)
Will outline regimen for her bowels

## 2014-08-28 NOTE — Progress Notes (Signed)
Pre visit review using our clinic review tool, if applicable. No additional management support is needed unless otherwise documented below in the visit note. 

## 2014-08-28 NOTE — Patient Instructions (Signed)
Please try miralax 1 capful with a full glass of water twice a day. You can also take senna-s 2 tabs daily. If you don't move your bowels well within 2 days, you can try a suppository or milk of magnesia. Once you get cleared out, continue the miralax once a day and the senna-s if still needed.

## 2014-08-28 NOTE — Progress Notes (Signed)
Subjective:    Patient ID: Tracy Matthews, female    DOB: 1921/12/23, 81 y.o.   MRN: [id number]  HPI Here with niece, Jeani Hawking  Follow up from last week with back pain Pain is "worse"--trouble getting up, sitting down, turning over in bed Was in left leg last week--now on right and radiating into groin Has tried the ibuprofen every 4 hours--and tylenol. Not really helping Pain starts to the side of the spine on the right  Reviewed her bowel problems Tried enema and then the pain started Using stool softener and MOM--not working Has tried suppository also Last stool 2.5 days ago but not normal  Current Outpatient Prescriptions on File Prior to Visit  Medication Sig Dispense Refill  . aspirin 81 MG tablet Take 81 mg by mouth daily.      Marland Kitchen ibuprofen (ADVIL) 200 MG tablet Take 1-2 tablets (200-400 mg total) by mouth every 8 (eight) hours as needed for moderate pain (with food).    Marland Kitchen levothyroxine (SYNTHROID, LEVOTHROID) 125 MCG tablet TAKE ONE TABLET EVERY DAY BEFORE BREAKFAST 90 tablet 3   No current facility-administered medications on file prior to visit.    No Known Allergies  Past Medical History  Diagnosis Date  . Hyperlipidemia   . Osteoporosis   . Bladder cancer     Dr Bernardo Heater  . Diverticulosis of colon 1996    on sig  . PVD (peripheral vascular disease)   . Macular degeneration     legally blind  . Subclavian vein obstruction 1999    Left, no Rx, Dr Rollene Fare  . Unspecified hypothyroidism   . Legal blindness     Past Surgical History  Procedure Laterality Date  . Bladder surgery  [phone]    bladder cancer removed- [facility], then [facility]  . Cataract extraction      Family History  Problem Relation Age of Onset  . Other Mother     enlarged heart  . Diabetes      strong in family  . Heart attack Brother 61  . Diabetes Brother   . Cancer Brother     bladder ca  . Hypertension Neg Hx   . Breast cancer Neg Hx   . Colon cancer Neg Hx      History   Social History  . Marital Status: Widowed    Spouse Name: N/A  . Number of Children: 0  . Years of Education: N/A   Occupational History  . Not on file.   Social History Main Topics  . Smoking status: Former Research scientist (life sciences)  . Smokeless tobacco: Never Used  . Alcohol Use: No  . Drug Use: No  . Sexual Activity: Not on file   Other Topics Concern  . Not on file   Social History Narrative   Widowed 11/08   No children   Doesn't drive   Has living will. Has DNR form already- wants this continued.    Request husbands niece [facility] or [place] as health care POA   Wouldn't want feeding tube   Review of Systems Lots of gas and burping Eating fair    Objective:   Physical Exam  Abdominal: Soft. She exhibits distension.  Slight distention without tenderness  Musculoskeletal:  Very slow sitting and standing but can do it SLR to ~90 degrees without clear problem No spine tenderness ROM of hips is fair--right actually normal, and left just mild decrease in internal rotation  Neurological:  Antalgic gait No focal weakness  Assessment & Plan:

## 2014-08-28 NOTE — Assessment & Plan Note (Signed)
Pain is not along spine Doesn't seem to be hip Still seems to be muscular  Will refer for PT Tramadol for better pain control

## 2014-08-30 ENCOUNTER — Encounter (HOSPITAL_COMMUNITY): Payer: Self-pay | Admitting: *Deleted

## 2014-08-30 DIAGNOSIS — Z87891 Personal history of nicotine dependence: Secondary | ICD-10-CM | POA: Diagnosis not present

## 2014-08-30 DIAGNOSIS — Y9289 Other specified places as the place of occurrence of the external cause: Secondary | ICD-10-CM | POA: Diagnosis not present

## 2014-08-30 DIAGNOSIS — Z8739 Personal history of other diseases of the musculoskeletal system and connective tissue: Secondary | ICD-10-CM | POA: Diagnosis not present

## 2014-08-30 DIAGNOSIS — E039 Hypothyroidism, unspecified: Secondary | ICD-10-CM | POA: Diagnosis not present

## 2014-08-30 DIAGNOSIS — Y9389 Activity, other specified: Secondary | ICD-10-CM | POA: Insufficient documentation

## 2014-08-30 DIAGNOSIS — K5730 Diverticulosis of large intestine without perforation or abscess without bleeding: Secondary | ICD-10-CM | POA: Diagnosis not present

## 2014-08-30 DIAGNOSIS — H548 Legal blindness, as defined in USA: Secondary | ICD-10-CM | POA: Insufficient documentation

## 2014-08-30 DIAGNOSIS — X58XXXA Exposure to other specified factors, initial encounter: Secondary | ICD-10-CM | POA: Diagnosis not present

## 2014-08-30 DIAGNOSIS — Z7982 Long term (current) use of aspirin: Secondary | ICD-10-CM | POA: Insufficient documentation

## 2014-08-30 DIAGNOSIS — Z8551 Personal history of malignant neoplasm of bladder: Secondary | ICD-10-CM | POA: Insufficient documentation

## 2014-08-30 DIAGNOSIS — S39012A Strain of muscle, fascia and tendon of lower back, initial encounter: Secondary | ICD-10-CM | POA: Diagnosis not present

## 2014-08-30 DIAGNOSIS — K5900 Constipation, unspecified: Secondary | ICD-10-CM | POA: Diagnosis not present

## 2014-08-30 DIAGNOSIS — Z8719 Personal history of other diseases of the digestive system: Secondary | ICD-10-CM | POA: Diagnosis not present

## 2014-08-30 DIAGNOSIS — Y998 Other external cause status: Secondary | ICD-10-CM | POA: Diagnosis not present

## 2014-08-30 DIAGNOSIS — Z79899 Other long term (current) drug therapy: Secondary | ICD-10-CM | POA: Insufficient documentation

## 2014-08-30 DIAGNOSIS — Z8639 Personal history of other endocrine, nutritional and metabolic disease: Secondary | ICD-10-CM | POA: Insufficient documentation

## 2014-08-30 LAB — CBC WITH DIFFERENTIAL/PLATELET
Basophils Absolute: 0 10*3/uL (ref 0.0–0.1)
Basophils Relative: 1 % (ref 0–1)
EOS ABS: 0.3 10*3/uL (ref 0.0–0.7)
Eosinophils Relative: 4 % (ref 0–5)
HCT: 45.9 % (ref 36.0–46.0)
Hemoglobin: 15.4 g/dL — ABNORMAL HIGH (ref 12.0–15.0)
LYMPHS ABS: 1.2 10*3/uL (ref 0.7–4.0)
LYMPHS PCT: 17 % (ref 12–46)
MCH: 30.5 pg (ref 26.0–34.0)
MCHC: 33.6 g/dL (ref 30.0–36.0)
MCV: 90.9 fL (ref 78.0–100.0)
Monocytes Absolute: 0.6 10*3/uL (ref 0.1–1.0)
Monocytes Relative: 9 % (ref 3–12)
NEUTROS PCT: 69 % (ref 43–77)
Neutro Abs: 4.8 10*3/uL (ref 1.7–7.7)
PLATELETS: 200 10*3/uL (ref 150–400)
RBC: 5.05 MIL/uL (ref 3.87–5.11)
RDW: 14.5 % (ref 11.5–15.5)
WBC: 6.9 10*3/uL (ref 4.0–10.5)

## 2014-08-30 LAB — COMPREHENSIVE METABOLIC PANEL
ALBUMIN: 3.6 g/dL (ref 3.5–5.2)
ALK PHOS: 76 U/L (ref 39–117)
ALT: 20 U/L (ref 0–35)
AST: 24 U/L (ref 0–37)
Anion gap: 11 (ref 5–15)
BUN: 11 mg/dL (ref 6–23)
CHLORIDE: 98 mmol/L (ref 96–112)
CO2: 27 mmol/L (ref 19–32)
Calcium: 9.6 mg/dL (ref 8.4–10.5)
Creatinine, Ser: 1.01 mg/dL (ref 0.50–1.10)
GFR calc Af Amer: 55 mL/min — ABNORMAL LOW (ref >90)
GFR calc non Af Amer: 48 mL/min — ABNORMAL LOW (ref >90)
Glucose, Bld: 125 mg/dL — ABNORMAL HIGH (ref 70–99)
Potassium: 4.2 mmol/L (ref 3.5–5.1)
SODIUM: 136 mmol/L (ref 135–145)
TOTAL PROTEIN: 7 g/dL (ref 6.0–8.3)
Total Bilirubin: 0.7 mg/dL (ref 0.3–1.2)

## 2014-08-30 LAB — LIPASE, BLOOD: Lipase: 25 U/L (ref 11–59)

## 2014-08-30 NOTE — ED Notes (Signed)
Since last Monday a week ago the pt has had abd pain and back pain with constipation.  She saw her doctor last Monday and again this Monday  And was diagnosed with a pinched muscle in her back.  Her last bn was  Sunday.  Her pain has increased today.  She thinks something else is   Going on

## 2014-08-31 ENCOUNTER — Emergency Department (HOSPITAL_COMMUNITY)
Admission: EM | Admit: 2014-08-31 | Discharge: 2014-08-31 | Disposition: A | Discharge status: Home / Self Care | Payer: Medicare Other | Attending: Emergency Medicine | Admitting: Emergency Medicine

## 2014-08-31 ENCOUNTER — Encounter (HOSPITAL_COMMUNITY): Payer: Self-pay | Admitting: Radiology

## 2014-08-31 ENCOUNTER — Telehealth: Payer: Self-pay

## 2014-08-31 ENCOUNTER — Emergency Department (HOSPITAL_COMMUNITY): Payer: Medicare Other

## 2014-08-31 DIAGNOSIS — K5900 Constipation, unspecified: Secondary | ICD-10-CM

## 2014-08-31 DIAGNOSIS — K5730 Diverticulosis of large intestine without perforation or abscess without bleeding: Secondary | ICD-10-CM | POA: Diagnosis not present

## 2014-08-31 DIAGNOSIS — S39012A Strain of muscle, fascia and tendon of lower back, initial encounter: Secondary | ICD-10-CM

## 2014-08-31 LAB — URINALYSIS, ROUTINE W REFLEX MICROSCOPIC
BILIRUBIN URINE: NEGATIVE
GLUCOSE, UA: NEGATIVE mg/dL
KETONES UR: NEGATIVE mg/dL
LEUKOCYTES UA: NEGATIVE
Nitrite: NEGATIVE
PH: 6 (ref 5.0–8.0)
PROTEIN: NEGATIVE mg/dL
Specific Gravity, Urine: 1.009 (ref 1.005–1.030)
Urobilinogen, UA: 0.2 mg/dL (ref 0.0–1.0)

## 2014-08-31 LAB — URINE MICROSCOPIC-ADD ON

## 2014-08-31 MED ORDER — METHOCARBAMOL 500 MG PO TABS: 250.0000 mg | ORAL_TABLET | Freq: Two times a day (BID) | ORAL | Status: DC

## 2014-08-31 MED ORDER — MILK AND MOLASSES ENEMA
1.0000 | Freq: Once | RECTAL | Status: AC
  Administered 2014-08-31: 250 mL via RECTAL
  Filled 2014-08-31: qty 250

## 2014-08-31 MED ORDER — METHOCARBAMOL 500 MG PO TABS
500.0000 mg | ORAL_TABLET | Freq: Once | ORAL | Status: AC
  Administered 2014-08-31: 500 mg via ORAL
  Filled 2014-08-31: qty 1

## 2014-08-31 MED ORDER — IOHEXOL 300 MG/ML  SOLN
100.0000 mL | Freq: Once | INTRAMUSCULAR | Status: AC | PRN
  Administered 2014-08-31: 100 mL via INTRAVENOUS

## 2014-08-31 MED ORDER — IOHEXOL 300 MG/ML  SOLN
25.0000 mL | INTRAMUSCULAR | Status: AC
  Administered 2014-08-31: 25 mL via ORAL

## 2014-08-31 MED ORDER — TRAMADOL HCL 50 MG PO TABS
25.0000 mg | ORAL_TABLET | Freq: Once | ORAL | Status: AC
  Administered 2014-08-31: 25 mg via ORAL
  Filled 2014-08-31: qty 1

## 2014-08-31 NOTE — Telephone Encounter (Signed)
I saw her today Not ready to go home so she will need to be admitted I will see her formally for admit next Tuesday

## 2014-08-31 NOTE — ED Provider Notes (Signed)
CSN: [id number]     Arrival date & time 08/30/14  2246 History   First MD Initiated Contact with Patient 08/31/14 0004     Chief Complaint  Patient presents with  . Abdominal Pain    (Consider location/radiation/quality/duration/timing/severity/associated sxs/prior Treatment) HPI Comments: Patient is a 81 year old female with a history of osteoporosis, hyperlipidemia, diverticulosis, and unspecified hypothyroidism who presents to the emergency department for further evaluation of abdominal pain and back pain. Patient states that her symptoms began 10 days ago. She saw her primary care provider at the onset of her symptoms who diagnosed her with constipation and a muscle strain in her low back. Patient reports taking MiraLAX as well as a stool softener. She states that she has not had a normal bowel movement in the last 10 days, but does report some small passage of stools sporadically. Her last bowel movement was 3 days ago. Patient reports a worsening, throbbing pain in her low back. She states that the pain traveled down her left leg initially, but she now feels the pain traveled down her right leg. She describes her abdominal pain as a soreness which is waxing and waning and intermittent. She believes her belly appears more distended than usual. No associated fever, chest pain, shortness of breath, nausea, vomiting, melena or hematochezia, or bowel/bladder incontinence; patient does have some mild stress incontinence at baseline.  Patient is a 81 y.o. female presenting with abdominal pain. The history is provided by the patient and a relative. No language interpreter was used.  Abdominal Pain Associated symptoms: constipation     Past Medical History  Diagnosis Date  . Hyperlipidemia   . Osteoporosis   . Bladder cancer     Dr Bernardo Heater  . Diverticulosis of colon 1996    on sig  . PVD (peripheral vascular disease)   . Macular degeneration     legally blind  . Subclavian vein obstruction  1999    Left, no Rx, Dr Rollene Fare  . Unspecified hypothyroidism   . Legal blindness    Past Surgical History  Procedure Laterality Date  . Bladder surgery  [phone]    bladder cancer removed- [facility], then [facility]  . Cataract extraction     Family History  Problem Relation Age of Onset  . Other Mother     enlarged heart  . Diabetes      strong in family  . Heart attack Brother 45  . Diabetes Brother   . Cancer Brother     bladder ca  . Hypertension Neg Hx   . Breast cancer Neg Hx   . Colon cancer Neg Hx    History  Substance Use Topics  . Smoking status: Former Research scientist (life sciences)  . Smokeless tobacco: Never Used  . Alcohol Use: No   OB History    No data available      Review of Systems  Gastrointestinal: Positive for abdominal pain and constipation.  Musculoskeletal: Positive for back pain.  All other systems reviewed and are negative.   Allergies  Review of patient's allergies indicates no known allergies.  Home Medications   Prior to Admission medications   Medication Sig Start Date End Date Taking? Authorizing Provider  acetaminophen (TYLENOL) 500 MG tablet Take 500 mg by mouth every 6 (six) hours as needed.    Historical Provider, MD  aspirin 81 MG tablet Take 81 mg by mouth daily.      Historical Provider, MD  ibuprofen (ADVIL) 200 MG tablet Take 1-2 tablets (200-400 mg total)  by mouth every 8 (eight) hours as needed for moderate pain (with food). 08/21/14   Tonia Ghent, MD  levothyroxine (SYNTHROID, LEVOTHROID) 125 MCG tablet TAKE ONE TABLET EVERY DAY BEFORE BREAKFAST 01/13/14   Venia Carbon, MD  traMADol (ULTRAM) 50 MG tablet Take 0.5-1 tablets (25-50 mg total) by mouth 3 (three) times daily as needed. 08/28/14   Venia Carbon, MD   BP 172/64 mmHg  Pulse 70  Temp(Src) 97.6 F (36.4 C)  Resp 18  SpO2 96%   Physical Exam  Constitutional: She is oriented to person, place, and time. She appears well-developed and well-nourished. No distress.   Nontoxic/nonseptic appearing  HENT:  Head: Normocephalic and atraumatic.  Mouth/Throat: Oropharynx is clear and moist. No oropharyngeal exudate.  Eyes: Conjunctivae and EOM are normal. No scleral icterus.  Neck: Normal range of motion.  Cardiovascular: Normal rate, regular rhythm and intact distal pulses.   Pulmonary/Chest: Effort normal and breath sounds normal. No respiratory distress. She has no wheezes. She has no rales.  Respirations even and unlabored. Lungs clear.  Abdominal: Soft. There is no tenderness. There is no rebound and no guarding.  Soft, mildly distended abdomen with normoactive bowel sounds in all quadrants. No focal tenderness or peritoneal signs appreciated.  Genitourinary:  Exam chaperoned by nurse. Normal rectal tone. No stool in the rectal vault to suggest impaction.  Musculoskeletal: Normal range of motion. She exhibits tenderness.  Tenderness to palpation to the lumbar midline at approximately L5. No bony deformity, step-offs, or crepitus  Neurological: She is alert and oriented to person, place, and time. She exhibits normal muscle tone. Coordination normal.  GCS 15. No focal neurologic deficits appreciated. Patient moves extremities without ataxia. Speech is goal oriented.  Skin: Skin is warm and dry. No rash noted. She is not diaphoretic. No erythema. No pallor.  Psychiatric: She has a normal mood and affect. Her behavior is normal.  Nursing note and vitals reviewed.   ED Course  Procedures (including critical care time) Labs Review Labs Reviewed  CBC WITH DIFFERENTIAL/PLATELET - Abnormal; Notable for the following:    Hemoglobin 15.4 (*)    All other components within normal limits  COMPREHENSIVE METABOLIC PANEL - Abnormal; Notable for the following:    Glucose, Bld 125 (*)    GFR calc non Af Amer 48 (*)    GFR calc Af Amer 55 (*)    All other components within normal limits  LIPASE, BLOOD  URINALYSIS, ROUTINE W REFLEX MICROSCOPIC    Imaging  Review Ct Abdomen Pelvis W Contrast  08/31/2014   CLINICAL DATA:  81 year old female with abdominal pain. Abdominal and back pain with constipation for 1 week.  EXAM: CT ABDOMEN AND PELVIS WITH CONTRAST  TECHNIQUE: Multidetector CT imaging of the abdomen and pelvis was performed using the standard protocol following bolus administration of intravenous contrast.  CONTRAST:  144mL OMNIPAQUE IOHEXOL 300 MG/ML  SOLN  COMPARISON:  None.  FINDINGS: Subpleural reticulation in both lung bases. No consolidation or pleural effusion.  There are no focal hepatic lesions. The gallbladder is borderline distended. The spleen and adrenal glands are normal. Pancreatic atrophy without ductal dilatation or surrounding inflammatory change. Kidneys demonstrate symmetric enhancement and excretion. There is no hydronephrosis or perinephric stranding.  Moderate hiatal hernia. Stomach is decompressed. There are no dilated or thickened bowel loops. Moderate volume of colonic stool. There is diverticulosis from the splenic flexure through the sigmoid colon without diverticulitis. The appendix is not definitively identified.  Abdominal aorta is  normal in caliber with dense atherosclerosis. No retroperitoneal adenopathy. Small mesenteric lymph nodes without overt adenopathy.  Within the pelvis the bladder is physiologically distended. The uterus is atrophic, normal for age. There is no adnexal mass. No pelvic free fluid. No pelvic adenopathy.  There are no acute or suspicious osseous abnormalities. Diffuse degenerative change.  IMPRESSION: 1. Gallbladder appears borderline distended, however no signs of gallbladder inflammation. Right upper quadrant ultrasound recommended if there is concern for hepatobiliary pathology. 2. Moderate volume of colonic stool, suggesting constipation. Scattered colonic diverticulosis, no diverticulitis. 3. Incidental findings of atherosclerosis and moderate hiatal hernia.   Electronically Signed   By: Jeb Levering M.D.   On: 08/31/2014 03:59      EKG Interpretation None      MDM   Final diagnoses:  Constipation, unspecified constipation type  Low back strain, initial encounter    81 year old female presents to the emergency department for further evaluation of low back pain and constipation. Patient is afebrile and hemodynamically stable. Her laboratory workup is consistent with baseline. No leukocytosis today. Urinalysis does not suggest infection. CT abdomen pelvis shows moderate constipation. No other acute findings. No evidence of bowel obstruction.  Patient is neurovascularly intact. No red flags or signs concerning for cauda equina. Agree with outpatient PCP evaluation, that back pain is likely MSK in etiology. Symptoms managed in ED with tramadol and Robaxin. Patient also given enema to help promote bowel movement. Have discussed continued use of MiraLAX. Will add Robaxin to agent outpatient course of tramadol. Primary care follow up advised and return precautions given. Patient agreeable to plan with no unaddressed concerns. Case discussed also with my attending, Dr. Johnney Killian, who is in agreement with this workup, assessment, management plan, and patient's stability for discharge.   Filed Vitals:   08/31/14 0445 08/31/14 0452 08/31/14 0500 08/31/14 0730  BP: 121/61 179/64 148/56 131/38  Pulse: 64 68 62 56  Temp:    97.6 F (36.4 C)  Resp:  12  20  SpO2: 95% 95% 97% 93%     Antonietta Breach, PA-C 09/03/14 2223  Charlesetta Shanks, MD 09/05/14 [phone]

## 2014-08-31 NOTE — Telephone Encounter (Signed)
PLEASE NOTE: All timestamps contained within this report are represented as [place] Standard Time. CONFIDENTIALTY NOTICE: This fax transmission is intended only for the addressee. It contains information that is legally privileged, confidential or otherwise protected from use or disclosure. If you are not the intended recipient, you are strictly prohibited from reviewing, disclosing, copying using or disseminating any of this information or taking any action in reliance on or regarding this information. If you have received this fax in error, please notify us immediately by telephone so that we can arrange for its return to [place]. Phone: [phone], Toll-Free: [phone], Fax: [phone] Page: 1 of 1 Call Id: [id number] [place] Patient Name: Tracy Matthews Gender: Female DOB: 07-10-1921 Age: 57 Y 9 M 26 D Return Phone Number: Address: City/State/Zip: [place] [address] Client [place] - Client Client Site [place] Physician [place], [place] Type Call Caller Name [place] Phone Number [phone] Relationship To Patient Other Is this call to report lab results? No Call Type General Information Initial Comment Caller states is Laureen Ochs and is from [facility]. Had a patient in Respite care and her niece came to get her. Does not need a call back. Just letting the Dr. know. [phone] General Information Type Message Only Nurse Assessment Guidelines Guideline Title Affirmed Question Affirmed Notes Nurse Date/Time (Eastern Time) Disp. Time Eilene Ghazi Time) Disposition Final User 08/30/2014 10:18:35 PM General Information Provided Yes Garry Heater After Care Instructions Given Call Event Type User Date / Time Description

## 2014-08-31 NOTE — Discharge Instructions (Signed)
Recommend adding Robaxin to Tramadol for pain control as needed. Continue taking Miralax twice a day as well as a stool softener. Drink plenty of water. Avoid juices as they are constipating. Follow up with your doctor for a recheck of symptoms in 4 days.  Constipation Constipation is when a person has fewer than three bowel movements a week, has difficulty having a bowel movement, or has stools that are dry, hard, or larger than normal. As people grow older, constipation is more common. If you try to fix constipation with medicines that make you have a bowel movement (laxatives), the problem may get worse. Long-term laxative use may cause the muscles of the colon to become weak. A low-fiber diet, not taking in enough fluids, and taking certain medicines may make constipation worse.  CAUSES   Certain medicines, such as antidepressants, pain medicine, iron supplements, antacids, and water pills.   Certain diseases, such as diabetes, irritable bowel syndrome (IBS), thyroid disease, or depression.   Not drinking enough water.   Not eating enough fiber-rich foods.   Stress or travel.   Lack of physical activity or exercise.   Ignoring the urge to have a bowel movement.   Using laxatives too much.  SIGNS AND SYMPTOMS   Having fewer than three bowel movements a week.   Straining to have a bowel movement.   Having stools that are hard, dry, or larger than normal.   Feeling full or bloated.   Pain in the lower abdomen.   Not feeling relief after having a bowel movement.  DIAGNOSIS  Your health care provider will take a medical history and perform a physical exam. Further testing may be done for severe constipation. Some tests may include:  A barium enema X-ray to examine your rectum, colon, and, sometimes, your small intestine.   A sigmoidoscopy to examine your lower colon.   A colonoscopy to examine your entire colon. TREATMENT  Treatment will depend on the  severity of your constipation and what is causing it. Some dietary treatments include drinking more fluids and eating more fiber-rich foods. Lifestyle treatments may include regular exercise. If these diet and lifestyle recommendations do not help, your health care provider may recommend taking over-the-counter laxative medicines to help you have bowel movements. Prescription medicines may be prescribed if over-the-counter medicines do not work.  HOME CARE INSTRUCTIONS   Eat foods that have a lot of fiber, such as fruits, vegetables, whole grains, and beans.  Limit foods high in fat and processed sugars, such as french fries, hamburgers, cookies, candies, and soda.   A fiber supplement may be added to your diet if you cannot get enough fiber from foods.   Drink enough fluids to keep your urine clear or pale yellow.   Exercise regularly or as directed by your health care provider.   Go to the restroom when you have the urge to go. Do not hold it.   Only take over-the-counter or prescription medicines as directed by your health care provider. Do not take other medicines for constipation without talking to your health care provider first.  [place] IF:   You have bright red blood in your stool.   Your constipation lasts for more than 4 days or gets worse.   You have abdominal or rectal pain.   You have thin, pencil-like stools.   You have unexplained weight loss. MAKE SURE YOU:   Understand these instructions.  Will watch your condition.  Will get help right away  if you are not doing well or get worse. Document Released: 02/01/2004 Document Revised: 05/10/2013 Document Reviewed: 02/14/2013 [facility] Patient Information 2015 [place], [place]. This information is not intended to replace advice given to you by your health care provider. Make sure you discuss any questions you have with your health care provider.  Back Pain, Adult Low back pain is very  common. About 1 in 5 people have back pain.The cause of low back pain is rarely dangerous. The pain often gets better over time.About half of people with a sudden onset of back pain feel better in just 2 weeks. About 8 in 10 people feel better by 6 weeks.  CAUSES Some common causes of back pain include:  Strain of the muscles or ligaments supporting the spine.  Wear and tear (degeneration) of the spinal discs.  Arthritis.  Direct injury to the back. DIAGNOSIS Most of the time, the direct cause of low back pain is not known.However, back pain can be treated effectively even when the exact cause of the pain is unknown.Answering your caregiver's questions about your overall health and symptoms is one of the most accurate ways to make sure the cause of your pain is not dangerous. If your caregiver needs more information, he or she may order lab work or imaging tests (X-rays or MRIs).However, even if imaging tests show changes in your back, this usually does not require surgery. HOME CARE INSTRUCTIONS For many people, back pain returns.Since low back pain is rarely dangerous, it is often a condition that people can learn to [facility] their own.   Remain active. It is stressful on the back to sit or stand in one place. Do not sit, drive, or stand in one place for more than 30 minutes at a time. Take short walks on level surfaces as soon as pain allows.Try to increase the length of time you walk each day.  Do not stay in bed.Resting more than 1 or 2 days can delay your recovery.  Do not avoid exercise or work.Your body is made to move.It is not dangerous to be active, even though your back may hurt.Your back will likely heal faster if you return to being active before your pain is gone.  Pay attention to your body when you bend and lift. Many people have less discomfortwhen lifting if they bend their knees, keep the load close to their bodies,and avoid twisting. Often, the most  comfortable positions are those that put less stress on your recovering back.  Find a comfortable position to sleep. Use a firm mattress and lie on your side with your knees slightly bent. If you lie on your back, put a pillow under your knees.  Only take over-the-counter or prescription medicines as directed by your caregiver. Over-the-counter medicines to reduce pain and inflammation are often the most helpful.Your caregiver may prescribe muscle relaxant drugs.These medicines help dull your pain so you can more quickly return to your normal activities and healthy exercise.  Put ice on the injured area.  Put ice in a plastic bag.  Place a towel between your skin and the bag.  Leave the ice on for 15-20 minutes, 03-04 times a day for the first 2 to 3 days. After that, ice and heat may be alternated to reduce pain and spasms.  Ask your caregiver about trying back exercises and gentle massage. This may be of some benefit.  Avoid feeling anxious or stressed.Stress increases muscle tension and can worsen back pain.It is important to recognize when  you are anxious or stressed and learn ways to manage it.Exercise is a great option. SEEK MEDICAL CARE IF:  You have pain that is not relieved with rest or medicine.  You have pain that does not improve in 1 week.  You have new symptoms.  You are generally not feeling well. SEEK IMMEDIATE MEDICAL CARE IF:   You have pain that radiates from your back into your legs.  You develop new bowel or bladder control problems.  You have unusual weakness or numbness in your arms or legs.  You develop nausea or vomiting.  You develop abdominal pain.  You feel faint. Document Released: 05/05/2005 Document Revised: 11/04/2011 Document Reviewed: 09/06/2013 [facility] Patient Information 2015 [place], [place]. This information is not intended to replace advice given to you by your health care provider. Make sure you discuss any questions you have with  your health care provider.

## 2014-09-04 DIAGNOSIS — M6281 Muscle weakness (generalized): Secondary | ICD-10-CM | POA: Diagnosis not present

## 2014-09-04 DIAGNOSIS — R262 Difficulty in walking, not elsewhere classified: Secondary | ICD-10-CM | POA: Diagnosis not present

## 2014-09-04 DIAGNOSIS — M549 Dorsalgia, unspecified: Secondary | ICD-10-CM | POA: Diagnosis not present

## 2014-09-05 DIAGNOSIS — M549 Dorsalgia, unspecified: Secondary | ICD-10-CM | POA: Diagnosis not present

## 2014-09-05 DIAGNOSIS — R262 Difficulty in walking, not elsewhere classified: Secondary | ICD-10-CM | POA: Diagnosis not present

## 2014-09-05 DIAGNOSIS — M6281 Muscle weakness (generalized): Secondary | ICD-10-CM | POA: Diagnosis not present

## 2014-09-06 DIAGNOSIS — M6281 Muscle weakness (generalized): Secondary | ICD-10-CM | POA: Diagnosis not present

## 2014-09-06 DIAGNOSIS — R262 Difficulty in walking, not elsewhere classified: Secondary | ICD-10-CM | POA: Diagnosis not present

## 2014-09-06 DIAGNOSIS — M549 Dorsalgia, unspecified: Secondary | ICD-10-CM | POA: Diagnosis not present

## 2014-09-07 DIAGNOSIS — M6281 Muscle weakness (generalized): Secondary | ICD-10-CM | POA: Diagnosis not present

## 2014-09-07 DIAGNOSIS — R262 Difficulty in walking, not elsewhere classified: Secondary | ICD-10-CM | POA: Diagnosis not present

## 2014-09-07 DIAGNOSIS — M549 Dorsalgia, unspecified: Secondary | ICD-10-CM | POA: Diagnosis not present

## 2014-09-08 DIAGNOSIS — M549 Dorsalgia, unspecified: Secondary | ICD-10-CM | POA: Diagnosis not present

## 2014-09-08 DIAGNOSIS — R262 Difficulty in walking, not elsewhere classified: Secondary | ICD-10-CM | POA: Diagnosis not present

## 2014-09-08 DIAGNOSIS — M6281 Muscle weakness (generalized): Secondary | ICD-10-CM | POA: Diagnosis not present

## 2014-09-08 DIAGNOSIS — R1084 Generalized abdominal pain: Secondary | ICD-10-CM | POA: Diagnosis not present

## 2014-09-10 DIAGNOSIS — M549 Dorsalgia, unspecified: Secondary | ICD-10-CM | POA: Diagnosis not present

## 2014-09-10 DIAGNOSIS — M6281 Muscle weakness (generalized): Secondary | ICD-10-CM | POA: Diagnosis not present

## 2014-09-10 DIAGNOSIS — R262 Difficulty in walking, not elsewhere classified: Secondary | ICD-10-CM | POA: Diagnosis not present

## 2014-09-11 DIAGNOSIS — R262 Difficulty in walking, not elsewhere classified: Secondary | ICD-10-CM | POA: Diagnosis not present

## 2014-09-11 DIAGNOSIS — M549 Dorsalgia, unspecified: Secondary | ICD-10-CM | POA: Diagnosis not present

## 2014-09-11 DIAGNOSIS — M6281 Muscle weakness (generalized): Secondary | ICD-10-CM | POA: Diagnosis not present

## 2014-09-12 DIAGNOSIS — M549 Dorsalgia, unspecified: Secondary | ICD-10-CM | POA: Diagnosis not present

## 2014-09-12 DIAGNOSIS — M6281 Muscle weakness (generalized): Secondary | ICD-10-CM | POA: Diagnosis not present

## 2014-09-12 DIAGNOSIS — R262 Difficulty in walking, not elsewhere classified: Secondary | ICD-10-CM | POA: Diagnosis not present

## 2014-09-13 DIAGNOSIS — R262 Difficulty in walking, not elsewhere classified: Secondary | ICD-10-CM | POA: Diagnosis not present

## 2014-09-13 DIAGNOSIS — M6281 Muscle weakness (generalized): Secondary | ICD-10-CM | POA: Diagnosis not present

## 2014-09-13 DIAGNOSIS — M549 Dorsalgia, unspecified: Secondary | ICD-10-CM | POA: Diagnosis not present

## 2014-09-14 DIAGNOSIS — R1084 Generalized abdominal pain: Secondary | ICD-10-CM | POA: Diagnosis not present

## 2014-09-14 DIAGNOSIS — M549 Dorsalgia, unspecified: Secondary | ICD-10-CM | POA: Diagnosis not present

## 2014-09-14 DIAGNOSIS — R262 Difficulty in walking, not elsewhere classified: Secondary | ICD-10-CM | POA: Diagnosis not present

## 2014-09-14 DIAGNOSIS — M6281 Muscle weakness (generalized): Secondary | ICD-10-CM | POA: Diagnosis not present

## 2014-09-15 DIAGNOSIS — M549 Dorsalgia, unspecified: Secondary | ICD-10-CM | POA: Diagnosis not present

## 2014-09-15 DIAGNOSIS — M6281 Muscle weakness (generalized): Secondary | ICD-10-CM | POA: Diagnosis not present

## 2014-09-15 DIAGNOSIS — R262 Difficulty in walking, not elsewhere classified: Secondary | ICD-10-CM | POA: Diagnosis not present

## 2014-09-18 DIAGNOSIS — R262 Difficulty in walking, not elsewhere classified: Secondary | ICD-10-CM | POA: Diagnosis not present

## 2014-09-18 DIAGNOSIS — M549 Dorsalgia, unspecified: Secondary | ICD-10-CM | POA: Diagnosis not present

## 2014-09-18 DIAGNOSIS — M6281 Muscle weakness (generalized): Secondary | ICD-10-CM | POA: Diagnosis not present

## 2014-09-19 DIAGNOSIS — M549 Dorsalgia, unspecified: Secondary | ICD-10-CM | POA: Diagnosis not present

## 2014-09-19 DIAGNOSIS — M6281 Muscle weakness (generalized): Secondary | ICD-10-CM | POA: Diagnosis not present

## 2014-09-19 DIAGNOSIS — R262 Difficulty in walking, not elsewhere classified: Secondary | ICD-10-CM | POA: Diagnosis not present

## 2014-09-20 DIAGNOSIS — R262 Difficulty in walking, not elsewhere classified: Secondary | ICD-10-CM | POA: Diagnosis not present

## 2014-09-20 DIAGNOSIS — M6281 Muscle weakness (generalized): Secondary | ICD-10-CM | POA: Diagnosis not present

## 2014-09-20 DIAGNOSIS — M549 Dorsalgia, unspecified: Secondary | ICD-10-CM | POA: Diagnosis not present

## 2014-09-21 DIAGNOSIS — M6281 Muscle weakness (generalized): Secondary | ICD-10-CM | POA: Diagnosis not present

## 2014-09-21 DIAGNOSIS — M549 Dorsalgia, unspecified: Secondary | ICD-10-CM | POA: Diagnosis not present

## 2014-09-21 DIAGNOSIS — R262 Difficulty in walking, not elsewhere classified: Secondary | ICD-10-CM | POA: Diagnosis not present

## 2014-09-22 DIAGNOSIS — M549 Dorsalgia, unspecified: Secondary | ICD-10-CM | POA: Diagnosis not present

## 2014-09-22 DIAGNOSIS — R262 Difficulty in walking, not elsewhere classified: Secondary | ICD-10-CM | POA: Diagnosis not present

## 2014-09-22 DIAGNOSIS — M6281 Muscle weakness (generalized): Secondary | ICD-10-CM | POA: Diagnosis not present

## 2014-10-10 ENCOUNTER — Encounter: Payer: Self-pay | Admitting: Internal Medicine

## 2014-10-10 ENCOUNTER — Ambulatory Visit (INDEPENDENT_AMBULATORY_CARE_PROVIDER_SITE_OTHER): Payer: Medicare Other | Admitting: Internal Medicine

## 2014-10-10 VITALS — BP 150/70 | HR 75 | Temp 98.1°F | Wt 146.0 lb

## 2014-10-10 DIAGNOSIS — E441 Mild protein-calorie malnutrition: Secondary | ICD-10-CM | POA: Diagnosis not present

## 2014-10-10 DIAGNOSIS — K5909 Other constipation: Secondary | ICD-10-CM

## 2014-10-10 DIAGNOSIS — I739 Peripheral vascular disease, unspecified: Secondary | ICD-10-CM

## 2014-10-10 DIAGNOSIS — I700 Atherosclerosis of aorta: Secondary | ICD-10-CM

## 2014-10-10 DIAGNOSIS — M549 Dorsalgia, unspecified: Secondary | ICD-10-CM | POA: Diagnosis not present

## 2014-10-10 NOTE — Assessment & Plan Note (Signed)
Ongoing issues but more consistently going and less pain

## 2014-10-10 NOTE — Assessment & Plan Note (Signed)
No apparent symptoms Known PVD

## 2014-10-10 NOTE — Assessment & Plan Note (Signed)
No claudication Leg pain seems to be referred from the back

## 2014-10-10 NOTE — Assessment & Plan Note (Signed)
Ongoing problems Finished with OT Still with the PT--but probably close to ending Help at home Using the analgesics

## 2014-10-10 NOTE — Assessment & Plan Note (Signed)
Not much appetite Discussed adding ensure and getting one full meal from the restaurant daily

## 2014-10-10 NOTE — Progress Notes (Signed)
Subjective:    Patient ID: Tracy Matthews, female    DOB: July 03, 1921, 81 y.o.   MRN: [id number]  HPI Here with niece, Sunday Spillers  Still having pain in back --radiates down legs Uses some of the pain Gets around with rollator Daughters do her shopping Has caregivers from [place] to [place]. They are doing housework when she lets them  Still some lower abdominal discomfort Lots of gas Bowels have been more regular  Reviewed CT scan No worrisome findings but did have aortic atherosclerosis No claudication though   Current Outpatient Prescriptions on File Prior to Visit  Medication Sig Dispense Refill  . acetaminophen (TYLENOL) 500 MG tablet Take 500 mg by mouth every 6 (six) hours as needed.    Marland Kitchen aspirin 81 MG tablet Take 81 mg by mouth daily.      Marland Kitchen ibuprofen (ADVIL) 200 MG tablet Take 1-2 tablets (200-400 mg total) by mouth every 8 (eight) hours as needed for moderate pain (with food).    Marland Kitchen levothyroxine (SYNTHROID, LEVOTHROID) 125 MCG tablet TAKE ONE TABLET EVERY DAY BEFORE BREAKFAST 90 tablet 3   No current facility-administered medications on file prior to visit.    No Known Allergies  Past Medical History  Diagnosis Date  . Hyperlipidemia   . Osteoporosis   . Bladder cancer     Dr Bernardo Heater  . Diverticulosis of colon 1996    on sig  . PVD (peripheral vascular disease)   . Macular degeneration     legally blind  . Subclavian vein obstruction 1999    Left, no Rx, Dr Rollene Fare  . Unspecified hypothyroidism   . Legal blindness   . Aortic atherosclerosis     Past Surgical History  Procedure Laterality Date  . Bladder surgery  [phone]    bladder cancer removed- [facility] Hart, then [facility]  . Cataract extraction      Family History  Problem Relation Age of Onset  . Other Mother     enlarged heart  . Diabetes      strong in family  . Heart attack Brother 89  . Diabetes Brother   . Cancer Brother     bladder ca  . Hypertension Neg Hx   . Breast cancer Neg Hx   .  Colon cancer Neg Hx     History   Social History  . Marital Status: Widowed    Spouse Name: N/A  . Number of Children: 0  . Years of Education: N/A   Occupational History  . Not on file.   Social History Main Topics  . Smoking status: Former Research scientist (life sciences)  . Smokeless tobacco: Never Used  . Alcohol Use: No  . Drug Use: No  . Sexual Activity: Not on file   Other Topics Concern  . Not on file   Social History Narrative   Widowed 11/08   No children   Doesn't drive   Has living will. Has DNR form already- wants this continued.    Request husbands niece [facility] or [place] as health care POA   Wouldn't want feeding tube    Review of Systems Appetite is still off--has lost ~10# Discussed supplements--she doesn't want this Family trying to get her to accept daily meals from [employer]    Objective:   Physical Exam  Constitutional: No distress.  Cardiovascular: Normal rate, regular rhythm and normal heart sounds.  Exam reveals no gallop.   No murmur heard. Pulmonary/Chest: Effort normal and breath sounds normal. No respiratory distress.  She has no wheezes. She has no rales.  Abdominal: Soft. Bowel sounds are normal. She exhibits no distension. There is no rebound and no guarding.  Mild tenderness but no specific locatioin  Musculoskeletal: She exhibits no edema or tenderness.  Psychiatric: She has a normal mood and affect. Her behavior is normal.          Assessment & Plan:

## 2014-10-10 NOTE — Progress Notes (Signed)
Pre visit review using our clinic review tool, if applicable. No additional management support is needed unless otherwise documented below in the visit note. 

## 2014-10-24 ENCOUNTER — Other Ambulatory Visit: Payer: Self-pay | Admitting: *Deleted

## 2014-10-24 MED ORDER — PROBIOTIC COLON SUPPORT PO CAPS: 1.0000 | ORAL_CAPSULE | Freq: Every day | ORAL | Status: DC

## 2014-11-02 ENCOUNTER — Other Ambulatory Visit: Payer: Self-pay | Admitting: Internal Medicine

## 2014-11-02 NOTE — Telephone Encounter (Signed)
rx called into pharmacy

## 2014-11-02 NOTE — Telephone Encounter (Signed)
Approved: #120 x 0 

## 2014-11-27 ENCOUNTER — Ambulatory Visit: Payer: Medicare Other

## 2014-12-12 ENCOUNTER — Ambulatory Visit (INDEPENDENT_AMBULATORY_CARE_PROVIDER_SITE_OTHER): Payer: Medicare Other | Admitting: Podiatry

## 2014-12-12 ENCOUNTER — Other Ambulatory Visit: Payer: Self-pay | Admitting: Internal Medicine

## 2014-12-12 DIAGNOSIS — B351 Tinea unguium: Secondary | ICD-10-CM | POA: Diagnosis not present

## 2014-12-12 DIAGNOSIS — M79676 Pain in unspecified toe(s): Secondary | ICD-10-CM | POA: Diagnosis not present

## 2014-12-12 NOTE — Progress Notes (Signed)
Subjective: 81 y.o. returns the office today for painful, elongated, thickened toenails which she is unable to do herself. Denies any redness or drainage around the nails. Denies any acute changes since last appointment and no new complaints today. Denies any systemic complaints such as fevers, chills, nausea, vomiting.   Objective: AAO 3, NAD DP/PT pulses palpable, CRT less than 3 seconds Nails hypertrophic, dystrophic, elongated, brittle, discolored 10. There is tenderness overlying the nails 1-5 bilaterally. There is no surrounding erythema or drainage along the nail sites. No open lesions or pre-ulcerative lesions are identified. No other areas of tenderness bilateral lower extremities. No overlying edema, erythema, increased warmth. No pain with calf compression, swelling, warmth, erythema.  Assessment: Patient presents with symptomatic onychomycosis  Plan: -Treatment options including alternatives, risks, complications were discussed -Nails sharply debrided 10 without complication/bleeding. -Discussed daily foot inspection. If there are any changes, to call the office immediately.  -Follow-up in 3 months or sooner if any problems are to arise. In the meantime, encouraged to call the office with any questions, concerns, changes symptoms.   Celesta Gentile, DPM

## 2014-12-12 NOTE — Telephone Encounter (Signed)
Px written for call in   

## 2014-12-12 NOTE — Telephone Encounter (Signed)
rx called into pharmacy

## 2014-12-12 NOTE — Telephone Encounter (Signed)
Last filled 11/02/14 [facility] PATIENT, Please send back to me for call in

## 2015-01-01 DIAGNOSIS — K1230 Oral mucositis (ulcerative), unspecified: Secondary | ICD-10-CM | POA: Diagnosis not present

## 2015-01-09 ENCOUNTER — Ambulatory Visit (INDEPENDENT_AMBULATORY_CARE_PROVIDER_SITE_OTHER): Payer: Medicare Other | Admitting: Internal Medicine

## 2015-01-09 ENCOUNTER — Encounter: Payer: Self-pay | Admitting: Internal Medicine

## 2015-01-09 VITALS — BP 138/60 | HR 64 | Temp 98.2°F | Ht 64.0 in | Wt 148.0 lb

## 2015-01-09 DIAGNOSIS — I25119 Atherosclerotic heart disease of native coronary artery with unspecified angina pectoris: Secondary | ICD-10-CM | POA: Diagnosis not present

## 2015-01-09 DIAGNOSIS — H548 Legal blindness, as defined in USA: Secondary | ICD-10-CM

## 2015-01-09 DIAGNOSIS — I739 Peripheral vascular disease, unspecified: Secondary | ICD-10-CM | POA: Diagnosis not present

## 2015-01-09 DIAGNOSIS — E039 Hypothyroidism, unspecified: Secondary | ICD-10-CM

## 2015-01-09 DIAGNOSIS — M5440 Lumbago with sciatica, unspecified side: Secondary | ICD-10-CM

## 2015-01-09 MED ORDER — NITROGLYCERIN 0.4 MG SL SUBL: 0.4000 mg | SUBLINGUAL_TABLET | SUBLINGUAL | Status: AC | PRN

## 2015-01-09 NOTE — Progress Notes (Signed)
Subjective:    Patient ID: Tracy Matthews, female    DOB: 1922/04/29, 81 y.o.   MRN: [id number]  HPI Here with daughter  Still has back pain---much improved though Still has caregiver for a few hours to help take bath every week--laundry and some cleaning (sheets) Has someone also to help with shopping Still cooks, does dishes, laundry Takes tramadol tid  Bowels are slow Does take the [employer] along okay with her blindness  Has leg pain ---down back and hips Pain constant--no clear worsening when walking in house  Gets tightness across chest--fairly brief Also on right arm and some tingling Gets sense she needs to take a big breath Relates to her nerves Did have cath by Dr Rollene Fare and had CAD--no stent put in  Current Outpatient Prescriptions on File Prior to Visit  Medication Sig Dispense Refill  . acetaminophen (TYLENOL) 500 MG tablet Take 500 mg by mouth every 6 (six) hours as needed.    Marland Kitchen aspirin 81 MG tablet Take 81 mg by mouth daily.      Marland Kitchen levothyroxine (SYNTHROID, LEVOTHROID) 125 MCG tablet TAKE ONE TABLET EVERY DAY BEFORE BREAKFAST 90 tablet 3  . polyethylene glycol powder (GLYCOLAX/MIRALAX) powder TAKE 17GM (LINE INSIDE CAP) IN EIGHT OUNCES OF WATER OR OTHER LIQUID DAILY AT8AM 527 g 1  . Probiotic Product (PROBIOTIC COLON SUPPORT) CAPS Take 1 capsule by mouth daily at 8 pm. 30 capsule 11  . SENNA CO Take 2 tablets by mouth as needed.    . traMADol (ULTRAM) 50 MG tablet TAKE ONE TABLET THREE TIMES A DAY AT 8AM, 2PM AND 8PM; AND ONE TABLETTHREE TIMES A DAY IF NEEDED FOR PAIN (Patient taking differently: TAKE ONE TABLET THREE TIMES A DAY AT 8AM, 2PM AND 8PM) 120 tablet 0   No current facility-administered medications on file prior to visit.    No Known Allergies  Past Medical History  Diagnosis Date  . Hyperlipidemia   . Osteoporosis   . Bladder cancer     Dr Bernardo Heater  . Diverticulosis of colon 1996    on sig  . PVD (peripheral vascular disease)   .  Macular degeneration     legally blind  . Subclavian vein obstruction 1999    Left, no Rx, Dr Rollene Fare  . Unspecified hypothyroidism   . Legal blindness   . Aortic atherosclerosis     Past Surgical History  Procedure Laterality Date  . Bladder surgery  [phone]    bladder cancer removed- [facility], then [facility]  . Cataract extraction      Family History  Problem Relation Age of Onset  . Other Mother     enlarged heart  . Diabetes      strong in family  . Heart attack Brother 20  . Diabetes Brother   . Cancer Brother     bladder ca  . Hypertension Neg Hx   . Breast cancer Neg Hx   . Colon cancer Neg Hx     Social History   Social History  . Marital Status: Widowed    Spouse Name: N/A  . Number of Children: 0  . Years of Education: N/A   Occupational History  . Not on file.   Social History Main Topics  . Smoking status: Former Research scientist (life sciences)  . Smokeless tobacco: Never Used  . Alcohol Use: No  . Drug Use: No  . Sexual Activity: Not on file   Other Topics Concern  . Not on file  Social History Narrative   Widowed 11/08   No children   Doesn't drive   Has living will. Has DNR form already- wants this continued.    Request husbands niece [facility] or [place] as health care POA   Wouldn't want feeding tube   Review of Systems Eating well Weight up 2# Sleeps okay---pain meds to get her to sleep    Objective:   Physical Exam  Constitutional: She appears well-developed. No distress.  Neck: Normal range of motion. Neck supple. No thyromegaly present.  Cardiovascular: Normal rate, regular rhythm and normal heart sounds.  Exam reveals no gallop.   No murmur heard. Feet warm No pulse on left, very faint on right  Pulmonary/Chest: Effort normal and breath sounds normal. No respiratory distress. She has no wheezes. She has no rales.  Abdominal: Soft. There is no tenderness.  Musculoskeletal: She exhibits no edema.  Lymphadenopathy:    She has no cervical  adenopathy.  Psychiatric: She has a normal mood and affect. Her behavior is normal.          Assessment & Plan:

## 2015-01-09 NOTE — Assessment & Plan Note (Signed)
Getting along now with minimal help at home

## 2015-01-09 NOTE — Assessment & Plan Note (Signed)
Has fairly clear angina history

## 2015-01-09 NOTE — Assessment & Plan Note (Signed)
Chronic but better Getting along okay with the regular tramadol Suggested adding tylenol regularly but she isn't sure she needs more

## 2015-01-09 NOTE — Assessment & Plan Note (Signed)
Leg pain likely referred from back No critical symptoms and no real rest pain

## 2015-01-09 NOTE — Progress Notes (Signed)
Pre visit review using our clinic review tool, if applicable. No additional management support is needed unless otherwise documented below in the visit note. 

## 2015-01-09 NOTE — Assessment & Plan Note (Signed)
Seems euthyroid Will check labs next time

## 2015-01-15 ENCOUNTER — Other Ambulatory Visit: Payer: Self-pay | Admitting: Internal Medicine

## 2015-01-15 NOTE — Telephone Encounter (Signed)
12/12/2014 

## 2015-01-15 NOTE — Telephone Encounter (Signed)
Approved: okay tramadol #120 x 0 miralax for a year

## 2015-01-16 NOTE — Telephone Encounter (Signed)
rx called into pharmacy

## 2015-02-19 ENCOUNTER — Other Ambulatory Visit: Payer: Self-pay | Admitting: Internal Medicine

## 2015-02-19 NOTE — Telephone Encounter (Signed)
Approved: #120 x 0 

## 2015-02-19 NOTE — Telephone Encounter (Signed)
01/16/2015 

## 2015-02-19 NOTE — Telephone Encounter (Signed)
rx called into pharmacy

## 2015-02-28 ENCOUNTER — Ambulatory Visit (INDEPENDENT_AMBULATORY_CARE_PROVIDER_SITE_OTHER): Payer: Medicare Other | Admitting: Podiatry

## 2015-02-28 ENCOUNTER — Ambulatory Visit: Payer: Medicare Other

## 2015-02-28 ENCOUNTER — Encounter: Payer: Self-pay | Admitting: Podiatry

## 2015-02-28 DIAGNOSIS — M79676 Pain in unspecified toe(s): Secondary | ICD-10-CM | POA: Diagnosis not present

## 2015-02-28 DIAGNOSIS — B351 Tinea unguium: Secondary | ICD-10-CM | POA: Diagnosis not present

## 2015-02-28 NOTE — Progress Notes (Signed)
She presents today with a chief complaint of painful elongated toenails 1 through 5 bilateral.  Objective: Her toenails are thick yellow dystrophic onychomycotic pulses remain palpable no open lesions or wounds.  Assessment: Pain and limb secondary to onychomycosis 1 through 5 bilateral.  Plan: Debridement of nails 1 through 5 bilateral covered service secondary to pain.

## 2015-03-08 ENCOUNTER — Other Ambulatory Visit: Payer: Self-pay | Admitting: Internal Medicine

## 2015-03-15 DIAGNOSIS — Z23 Encounter for immunization: Secondary | ICD-10-CM | POA: Diagnosis not present

## 2015-03-21 DIAGNOSIS — H353130 Nonexudative age-related macular degeneration, bilateral, stage unspecified: Secondary | ICD-10-CM | POA: Diagnosis not present

## 2015-04-16 ENCOUNTER — Other Ambulatory Visit: Payer: Self-pay | Admitting: *Deleted

## 2015-04-16 NOTE — Telephone Encounter (Signed)
Please call to check what happened. I don't see a record of any call for refill. What does she need? She is a patient--so we wouldn't have stated this. Who did the pharmacy call?

## 2015-04-16 NOTE — Telephone Encounter (Signed)
[facility] Patient Name: RORI MEINDERS Gender: Female DOB: 04-23-1922 Age: 81 Y 8 M 6 D Return Phone Number: [phone] (Primary) Address: City/State/Zip: [place] [address] Client [facility] Night - Client Client Site [place] Physician Viviana Simpler Contact Type Call [place] Name [place] Phone Number [phone] Relationship To Patient Other relative Is this call to report lab results? No Call Type General Information Initial Comment Caller stated her aunt was denied a refill due to being told that she was not a patient. Caller stated she is a patient and would like some clarifiaction. General Information Type Message Only Nurse Assessment Guidelines Guideline Title Affirmed Question Affirmed Notes Nurse Date/Time (Eastern Time) Disp. Time Eilene Ghazi Time) Disposition Final User 04/13/2015 3:22:05 PM General Information Provided Yes Hermine Messick

## 2015-04-16 NOTE — Telephone Encounter (Signed)
Left message for patient to return phone call.  

## 2015-04-17 NOTE — Telephone Encounter (Signed)
[name] pts niece request refill tramadol to [place]. Spoke with [employer] and electronic refill was done on 04/11/15 requesting tramadol; [place] got back denial on 04/13/15.(office closed at [facility]) not sure what happened but [facility] said sent electronic request to Dr Silvio Pate. Apologized to [name] and she request cb when refilled. Pt has med to last thru 04/18/15.

## 2015-04-18 MED ORDER — TRAMADOL HCL 50 MG PO TABS: ORAL_TABLET | ORAL | Status: DC

## 2015-04-18 NOTE — Telephone Encounter (Signed)
Approved: #120 x 0 of the tramadol

## 2015-04-18 NOTE — Telephone Encounter (Signed)
rx called into pharmacy Spoke with daughter and advised results.  

## 2015-05-17 ENCOUNTER — Encounter: Payer: Self-pay | Admitting: *Deleted

## 2015-06-04 ENCOUNTER — Ambulatory Visit: Payer: Medicare Other | Admitting: Podiatry

## 2015-06-04 ENCOUNTER — Ambulatory Visit: Payer: Medicare Other

## 2015-06-06 ENCOUNTER — Encounter: Payer: Self-pay | Admitting: Podiatry

## 2015-06-06 ENCOUNTER — Other Ambulatory Visit: Payer: Self-pay | Admitting: Internal Medicine

## 2015-06-06 ENCOUNTER — Ambulatory Visit (INDEPENDENT_AMBULATORY_CARE_PROVIDER_SITE_OTHER): Payer: Medicare Other | Admitting: Podiatry

## 2015-06-06 DIAGNOSIS — B351 Tinea unguium: Secondary | ICD-10-CM | POA: Diagnosis not present

## 2015-06-06 DIAGNOSIS — M79676 Pain in unspecified toe(s): Secondary | ICD-10-CM

## 2015-06-06 NOTE — Progress Notes (Signed)
She presents today with a chief complaint of painful elongated toenails and like to have them cut.  Objective: Vital signs are stable she is alert and oriented 3. She is blind. Her toenails are thick yellow dystrophic onychomycotic.  Assessment: Peroneal limb secondary to onychomycosis.  Plan: Debridement of toenails 1 through 5 bilateral covered service secondary to pain.

## 2015-06-06 NOTE — Telephone Encounter (Signed)
04/18/2015 

## 2015-06-07 NOTE — Telephone Encounter (Signed)
Approved: #120 x 0 

## 2015-06-07 NOTE — Telephone Encounter (Signed)
Rx called in as prescribed 

## 2015-07-17 ENCOUNTER — Ambulatory Visit (INDEPENDENT_AMBULATORY_CARE_PROVIDER_SITE_OTHER): Payer: Medicare Other | Admitting: Internal Medicine

## 2015-07-17 ENCOUNTER — Encounter: Payer: Self-pay | Admitting: Internal Medicine

## 2015-07-17 VITALS — BP 140/80 | HR 75 | Temp 97.9°F | Wt 154.0 lb

## 2015-07-17 DIAGNOSIS — E039 Hypothyroidism, unspecified: Secondary | ICD-10-CM | POA: Diagnosis not present

## 2015-07-17 DIAGNOSIS — E441 Mild protein-calorie malnutrition: Secondary | ICD-10-CM

## 2015-07-17 DIAGNOSIS — I700 Atherosclerosis of aorta: Secondary | ICD-10-CM | POA: Diagnosis not present

## 2015-07-17 DIAGNOSIS — I739 Peripheral vascular disease, unspecified: Secondary | ICD-10-CM

## 2015-07-17 DIAGNOSIS — I25119 Atherosclerotic heart disease of native coronary artery with unspecified angina pectoris: Secondary | ICD-10-CM | POA: Diagnosis not present

## 2015-07-17 LAB — CBC WITH DIFFERENTIAL/PLATELET
Basophils Absolute: 0.1 10*3/uL (ref 0.0–0.1)
Basophils Relative: 0.7 % (ref 0.0–3.0)
Eosinophils Absolute: 0.3 10*3/uL (ref 0.0–0.7)
Eosinophils Relative: 3.5 % (ref 0.0–5.0)
HEMATOCRIT: 43.5 % (ref 36.0–46.0)
Hemoglobin: 14.4 g/dL (ref 12.0–15.0)
Lymphocytes Relative: 19.6 % (ref 12.0–46.0)
Lymphs Abs: 1.5 10*3/uL (ref 0.7–4.0)
MCHC: 33.1 g/dL (ref 30.0–36.0)
MCV: 94.1 fl (ref 78.0–100.0)
MONO ABS: 0.5 10*3/uL (ref 0.1–1.0)
Monocytes Relative: 7 % (ref 3.0–12.0)
Neutro Abs: 5.3 10*3/uL (ref 1.4–7.7)
Neutrophils Relative %: 69.2 % (ref 43.0–77.0)
Platelets: 197 10*3/uL (ref 150.0–400.0)
RBC: 4.62 Mil/uL (ref 3.87–5.11)
RDW: 13.8 % (ref 11.5–15.5)
WBC: 7.6 10*3/uL (ref 4.0–10.5)

## 2015-07-17 LAB — COMPREHENSIVE METABOLIC PANEL
ALBUMIN: 4.2 g/dL (ref 3.5–5.2)
ALT: 10 U/L (ref 0–35)
AST: 13 U/L (ref 0–37)
Alkaline Phosphatase: 65 U/L (ref 39–117)
BUN: 19 mg/dL (ref 6–23)
CALCIUM: 9.6 mg/dL (ref 8.4–10.5)
CO2: 28 mEq/L (ref 19–32)
CREATININE: 0.86 mg/dL (ref 0.40–1.20)
Chloride: 103 mEq/L (ref 96–112)
GFR: 65.64 mL/min (ref >60.00)
Glucose, Bld: 99 mg/dL (ref 70–99)
Potassium: 4.2 mEq/L (ref 3.5–5.1)
SODIUM: 139 meq/L (ref 135–145)
TOTAL PROTEIN: 7.5 g/dL (ref 6.0–8.3)
Total Bilirubin: 0.7 mg/dL (ref 0.2–1.2)

## 2015-07-17 LAB — TSH: TSH: 1.67 u[IU]/mL (ref 0.35–4.50)

## 2015-07-17 LAB — T4, FREE: FREE T4: 1.14 ng/dL (ref 0.60–1.60)

## 2015-07-17 NOTE — Patient Instructions (Signed)
You can try activia yogurt instead of the probiotic pills.

## 2015-07-17 NOTE — Assessment & Plan Note (Signed)
Known CAD Hard to tell if chest pain is ischemic--usually resolves on its own

## 2015-07-17 NOTE — Assessment & Plan Note (Signed)
Pain in legs/back may be claudication variant ??spinal stenosis Some element of RLS No critical issues

## 2015-07-17 NOTE — Assessment & Plan Note (Signed)
Dense atherosclerosis on CT May be related to her PVD but no signs of renal involvement---will check labs

## 2015-07-17 NOTE — Assessment & Plan Note (Signed)
Weight is up some Seems to have better protein intake lately

## 2015-07-17 NOTE — Progress Notes (Signed)
Pre visit review using our clinic review tool, if applicable. No additional management support is needed unless otherwise documented below in the visit note. 

## 2015-07-17 NOTE — Assessment & Plan Note (Signed)
Seems to be euthryoid Due for labs 

## 2015-07-17 NOTE — Progress Notes (Signed)
Subjective:    Patient ID: Tracy Matthews, female    DOB: Sep 10, 1921, 82 y.o.   MRN: [id number]  HPI Here for follow up of chronic medical conditions With daughter Still has aide weekly--supervises shower, does the shopping and heavy cleaning  Ongoing constant back pain Better than it had been but some bad days Takes tylenol and tramadol  She makes her own meals Can see well enough to do this Frozen vegetables, pork chop/steak/chicken/fish/eggs Lots of milk and fruit Weight is up a few pounds since last visit  Walks with rollator Does get thigh and back pain if extended standing/walking Occasionally will have to get up out of bed---due to sensation in legs No abdominal pain--just some gas at times  Does have some upper chest pain and into left arm Often in AM after breakfast Not clearly with food or exertion Hasn't used the NTG  On thyroid Overdue for labs  Current Outpatient Prescriptions on File Prior to Visit  Medication Sig Dispense Refill  . acetaminophen (TYLENOL) 500 MG tablet Take 500 mg by mouth every 6 (six) hours as needed.    Marland Kitchen aspirin 81 MG tablet Take 81 mg by mouth daily.      Marland Kitchen levothyroxine (SYNTHROID, LEVOTHROID) 125 MCG tablet TAKE ONE TABLET EVERY DAY BEFORE BREAKFAST 90 tablet 3  . nitroGLYCERIN (NITROSTAT) 0.4 MG SL tablet Place 1 tablet (0.4 mg total) under the tongue every 5 (five) minutes as needed for chest pain. 25 tablet 1  . polyethylene glycol powder (GLYCOLAX/MIRALAX) powder TAKE 17GM (LINE INSIDE CAP) IN EIGHT OUNCES OF WATER OR OTHER LIQUID DAILY 527 g 3  . Probiotic Product (PROBIOTIC COLON SUPPORT) CAPS Take 1 capsule by mouth daily at 8 pm. 30 capsule 11  . SENNA CO Take 2 tablets by mouth as needed.    . traMADol (ULTRAM) 50 MG tablet TAKE ONE TABLET THREE TIMES A DAY AT 8AM, 2PM AND 8PM; AND TAKE ONE TABLET THREE TIMES A DAY IF NEEDED FOR PAIN 120 tablet 0   No current facility-administered medications on file prior to visit.     No Known Allergies  Past Medical History  Diagnosis Date  . Hyperlipidemia   . Osteoporosis   . Bladder cancer [facility])     Dr Bernardo Heater  . Diverticulosis of colon 1996    on sig  . PVD (peripheral vascular disease) ([place])   . Macular degeneration     legally blind  . Subclavian vein obstruction (HCC) 1999    Left, no Rx, Dr Rollene Fare  . Unspecified hypothyroidism   . Legal blindness   . Aortic atherosclerosis [facility])     Past Surgical History  Procedure Laterality Date  . Bladder surgery  [phone]    bladder cancer removed- [facility], then [facility]  . Cataract extraction      Family History  Problem Relation Age of Onset  . Other Mother     enlarged heart  . Diabetes      strong in family  . Heart attack Brother 46  . Diabetes Brother   . Cancer Brother     bladder ca  . Hypertension Neg Hx   . Breast cancer Neg Hx   . Colon cancer Neg Hx     Social History   Social History  . Marital Status: Widowed    Spouse Name: N/A  . Number of Children: 0  . Years of Education: N/A   Occupational History  . Not on file.   Social History  Main Topics  . Smoking status: Former Research scientist (life sciences)  . Smokeless tobacco: Never Used  . Alcohol Use: No  . Drug Use: No  . Sexual Activity: Not on file   Other Topics Concern  . Not on file   Social History Narrative   Widowed 11/08   No children   Doesn't drive   Has living will. Has DNR form already- wants this continued.    Request husbands niece [facility] or [place] as health care POA   Wouldn't want feeding tube   Review of Systems Bowels are irregular--- several times one day, then skips days. Occasionally has to go at night Urinary frequency and nocturia hourly Still grunts--but breathing is okay Rare choking/gas sensation in upper chest Upset about the cost of her probiotic--not by my direction. Discussed trying activia instead    Objective:   Physical Exam  Constitutional: She appears well-developed. No  distress.  Neck: Normal range of motion. Neck supple. No thyromegaly present.  Cardiovascular: Normal rate, regular rhythm and normal heart sounds.  Exam reveals no gallop.   No murmur heard. Trace pedal pulses  Pulmonary/Chest: Effort normal and breath sounds normal. No respiratory distress. She has no wheezes. She has no rales.  Musculoskeletal:  Trace edema  Lymphadenopathy:    She has no cervical adenopathy.  Skin:  No foot lesions  Psychiatric: She has a normal mood and affect. Her behavior is normal.          Assessment & Plan:

## 2015-07-18 ENCOUNTER — Encounter: Payer: Self-pay | Admitting: *Deleted

## 2015-08-21 ENCOUNTER — Other Ambulatory Visit: Payer: Self-pay | Admitting: Internal Medicine

## 2015-08-21 NOTE — Telephone Encounter (Signed)
Last refill of tramadol 06-07-15 #120 Last OV 07-17-15 Next OV with Katha Cabal 01-15-16

## 2015-08-22 NOTE — Telephone Encounter (Signed)
Approved: #120 x 0 for tramadol 1 year for miralax

## 2015-08-22 NOTE — Telephone Encounter (Signed)
Called in Tramadol on Voice mail Miralax sent electronically

## 2015-09-05 ENCOUNTER — Encounter: Payer: Self-pay | Admitting: Podiatry

## 2015-09-05 ENCOUNTER — Ambulatory Visit (INDEPENDENT_AMBULATORY_CARE_PROVIDER_SITE_OTHER): Payer: Medicare Other | Admitting: Podiatry

## 2015-09-05 DIAGNOSIS — M79676 Pain in unspecified toe(s): Secondary | ICD-10-CM | POA: Diagnosis not present

## 2015-09-05 DIAGNOSIS — B351 Tinea unguium: Secondary | ICD-10-CM | POA: Diagnosis not present

## 2015-09-05 NOTE — Progress Notes (Signed)
She presents today with a chief complaint of painful elongated toenails 1 through 5 bilateral.  Objective: Pulses remain palpable bilateral. No open lesions or wounds. Good range of motion of the ankle joint and all joints distal to it. Toenails are thick yellow dystrophic with mycotic painful palpation with sharp incurvated margins.  Assessment: Pain in limb secondary to onychomycosis and ingrown toenails.  Plan: Debridement of toenails 1 through 5 bilateral. Return to clinic 3 months.

## 2015-12-05 ENCOUNTER — Ambulatory Visit (INDEPENDENT_AMBULATORY_CARE_PROVIDER_SITE_OTHER): Payer: Medicare Other | Admitting: Podiatry

## 2015-12-05 ENCOUNTER — Encounter: Payer: Self-pay | Admitting: Podiatry

## 2015-12-05 DIAGNOSIS — M79676 Pain in unspecified toe(s): Secondary | ICD-10-CM

## 2015-12-05 DIAGNOSIS — B351 Tinea unguium: Secondary | ICD-10-CM

## 2015-12-05 NOTE — Progress Notes (Signed)
She presents today with a chief complaint of painful elongated toenails.  Objective: Pulses are strongly palpable. Toenails are thick yellow dystrophic clinically mycotic and painful on palpation.  Assessment: Pain in limb secondary to onychomycosis 1 through 5 bilateral.  Plan: Debridement of toenails 1 through 5 bilateral covered service secondary to pain.

## 2016-01-15 ENCOUNTER — Ambulatory Visit: Payer: Medicare Other

## 2016-02-05 ENCOUNTER — Encounter: Payer: Self-pay | Admitting: Internal Medicine

## 2016-02-05 ENCOUNTER — Ambulatory Visit (INDEPENDENT_AMBULATORY_CARE_PROVIDER_SITE_OTHER): Payer: Medicare Other | Admitting: Internal Medicine

## 2016-02-05 VITALS — BP 110/60 | HR 66 | Temp 97.4°F | Ht 61.75 in | Wt 161.0 lb

## 2016-02-05 DIAGNOSIS — E039 Hypothyroidism, unspecified: Secondary | ICD-10-CM | POA: Diagnosis not present

## 2016-02-05 DIAGNOSIS — I739 Peripheral vascular disease, unspecified: Secondary | ICD-10-CM

## 2016-02-05 DIAGNOSIS — Z0000 Encounter for general adult medical examination without abnormal findings: Secondary | ICD-10-CM | POA: Diagnosis not present

## 2016-02-05 DIAGNOSIS — Z7189 Other specified counseling: Secondary | ICD-10-CM

## 2016-02-05 DIAGNOSIS — Z23 Encounter for immunization: Secondary | ICD-10-CM | POA: Diagnosis not present

## 2016-02-05 DIAGNOSIS — H548 Legal blindness, as defined in USA: Secondary | ICD-10-CM | POA: Diagnosis not present

## 2016-02-05 DIAGNOSIS — I700 Atherosclerosis of aorta: Secondary | ICD-10-CM | POA: Diagnosis not present

## 2016-02-05 DIAGNOSIS — I25119 Atherosclerotic heart disease of native coronary artery with unspecified angina pectoris: Secondary | ICD-10-CM | POA: Diagnosis not present

## 2016-02-05 DIAGNOSIS — M5440 Lumbago with sciatica, unspecified side: Secondary | ICD-10-CM

## 2016-02-05 LAB — COMPREHENSIVE METABOLIC PANEL
ALK PHOS: 69 U/L (ref 39–117)
ALT: 10 U/L (ref 0–35)
AST: 12 U/L (ref 0–37)
Albumin: 4 g/dL (ref 3.5–5.2)
BILIRUBIN TOTAL: 0.6 mg/dL (ref 0.2–1.2)
BUN: 18 mg/dL (ref 6–23)
CO2: 29 meq/L (ref 19–32)
Calcium: 9.5 mg/dL (ref 8.4–10.5)
Chloride: 102 mEq/L (ref 96–112)
Creatinine, Ser: 0.83 mg/dL (ref 0.40–1.20)
GFR: 68.3 mL/min (ref >60.00)
GLUCOSE: 92 mg/dL (ref 70–99)
Potassium: 4.2 mEq/L (ref 3.5–5.1)
SODIUM: 139 meq/L (ref 135–145)
Total Protein: 7.3 g/dL (ref 6.0–8.3)

## 2016-02-05 LAB — CBC WITH DIFFERENTIAL/PLATELET
BASOS ABS: 0 10*3/uL (ref 0.0–0.1)
Basophils Relative: 0.6 % (ref 0.0–3.0)
Eosinophils Absolute: 0.3 10*3/uL (ref 0.0–0.7)
Eosinophils Relative: 3.7 % (ref 0.0–5.0)
HCT: 39.8 % (ref 36.0–46.0)
Hemoglobin: 13.5 g/dL (ref 12.0–15.0)
LYMPHS ABS: 1.3 10*3/uL (ref 0.7–4.0)
Lymphocytes Relative: 17.7 % (ref 12.0–46.0)
MCHC: 33.9 g/dL (ref 30.0–36.0)
MCV: 92.2 fl (ref 78.0–100.0)
MONO ABS: 0.5 10*3/uL (ref 0.1–1.0)
Monocytes Relative: 6.9 % (ref 3.0–12.0)
NEUTROS ABS: 5.3 10*3/uL (ref 1.4–7.7)
Neutrophils Relative %: 71.1 % (ref 43.0–77.0)
PLATELETS: 190 10*3/uL (ref 150.0–400.0)
RBC: 4.32 Mil/uL (ref 3.87–5.11)
RDW: 14.8 % (ref 11.5–15.5)
WBC: 7.4 10*3/uL (ref 4.0–10.5)

## 2016-02-05 LAB — TSH: TSH: 1.91 u[IU]/mL (ref 0.35–4.50)

## 2016-02-05 LAB — T4, FREE: FREE T4: 0.98 ng/dL (ref 0.60–1.60)

## 2016-02-05 NOTE — Addendum Note (Signed)
Addended by: Pilar Grammes on: 02/05/2016 02:58 PM   Modules accepted: Orders

## 2016-02-05 NOTE — Assessment & Plan Note (Signed)
Seems to be euthyroid Will check labs 

## 2016-02-05 NOTE — Progress Notes (Signed)
Subjective:    Patient ID: Tracy Matthews, female    DOB: [birth year], 82 y.o.   MRN: [id number]  HPI  Here for initial Medicare wellness visit and follow up of chronic health conditions Reviewed form and advanced directives Reviewed other doctors Tries to exercise in her place--walks and exercise bike No alcohol or tobacco No falls--is very careful No really depressed. Limited ability to do things--due to blindness. Some degree of anhedonia Hearing is okay Needs some help with instrumental ADLs Memory seems okay  She has no new concerns Has ongoing pain-- tramadol does help. She mostly takes at night Needs refill  Uses tylenol during the day Back and some in knees Walks with rollator  Doesn't drive due to blindness Has people to help her Able to fix meals--caregiver goes to grocery store Does own laundry  No chest pain No SOB No dizziness No edema  Will get some leg pain at times Thinks it may be referred from hip or back No foot pain  Consistent with thyroid medication No change in energy levels  Current Outpatient Prescriptions on File Prior to Visit  Medication Sig Dispense Refill  . acetaminophen (TYLENOL) 500 MG tablet Take 500 mg by mouth every 6 (six) hours as needed.    Marland Kitchen aspirin 81 MG tablet Take 81 mg by mouth daily.      Marland Kitchen levothyroxine (SYNTHROID, LEVOTHROID) 125 MCG tablet TAKE ONE TABLET EVERY DAY BEFORE BREAKFAST 90 tablet 3  . nitroGLYCERIN (NITROSTAT) 0.4 MG SL tablet Place 1 tablet (0.4 mg total) under the tongue every 5 (five) minutes as needed for chest pain. 25 tablet 1  . polyethylene glycol powder (GLYCOLAX/MIRALAX) powder TAKE 17GM (LINE INSIDE CAP) IN EIGHT OUNCES OF WATER OR OTHER LIQUID DAILY 527 g 11  . traMADol (ULTRAM) 50 MG tablet TAKE ONE TABLET THREE TIMES A DAY AT 8AM, 2PM AND 8PM; AND TAKE ONE TABLET THREE TIMES A DAY IF NEEDED FOR PAIN 120 tablet 0  . Probiotic Product (PROBIOTIC COLON SUPPORT) CAPS Take 1 capsule by mouth daily  at 8 pm. (Patient not taking: Reported on 02/05/2016) 30 capsule 11  . SENNA CO Take 2 tablets by mouth as needed.     No current facility-administered medications on file prior to visit.     No Known Allergies  Past Medical History:  Diagnosis Date  . Aortic atherosclerosis ([place])   . Bladder cancer [facility])    Dr Bernardo Heater  . Diverticulosis of colon 1996   on sig  . Hyperlipidemia   . Legal blindness   . Macular degeneration    legally blind  . Osteoporosis   . PVD (peripheral vascular disease) ([place])   . 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 Unspecified hypothyroidism     Past Surgical History:  Procedure Laterality Date  . BLADDER SURGERY  [phone]   bladder cancer removed- [facility] Hart, then [facility]  . CATARACT EXTRACTION      Family History  Problem Relation Age of Onset  . Other Mother     enlarged heart  . Diabetes      strong in family  . Heart attack Brother 23  . Diabetes Brother   . Cancer Brother     bladder ca  . Hypertension Neg Hx   . Breast cancer Neg Hx   . Colon cancer Neg Hx     Social History   Social History  . Marital status: Widowed    Spouse  name: N/A  . Number of children: 0  . Years of education: N/A   Occupational History  . Not on file.   Social History Main Topics  . Smoking status: Former Research scientist (life sciences)  . Smokeless tobacco: Never Used  . Alcohol use No  . Drug use: No  . Sexual activity: Not on file   Other Topics Concern  . Not on file   Social History Narrative   Widowed 11/08   No children   Doesn't drive   Has living will. Has DNR form already- wants this continued.    Request husbands niece [facility] or [place] as health care POA   Wouldn't want feeding tube   Review of Systems Doesn't sleep a lot--hard to initiate. Sleeps well once she goes to sleep Appetite is okay Weight is up slightly Wears seat belt Teeth okay--keeps up with dentist No rash or apparent suspicious skin  lesions Bowels are slow--may go 3 days despite the senna and miralax (MOM occasionally) Voids a lot Sees Dr Milinda Pointer regularly for her nails    Objective:   Physical Exam  Constitutional: She is oriented to person, place, and time. She appears well-developed and well-nourished. No distress.  HENT:  Mouth/Throat: Oropharynx is clear and moist. No oropharyngeal exudate.  Neck: Normal range of motion. Neck supple. No thyromegaly present.  Cardiovascular: Normal rate, regular rhythm and normal heart sounds.   Faint pedal pulses  Pulmonary/Chest: Effort normal and breath sounds normal. No respiratory distress. She has no wheezes. She has no rales.  Abdominal: There is no tenderness.  Musculoskeletal:  Trace edema  Lymphadenopathy:    She has no cervical adenopathy.  Neurological: She is alert and oriented to person, place, and time.  President-- "Trump, Obama, the son and the dad" 60- "never good at math" Can't spell backwards Recall 2/3  Skin:  Purplish discoloration in toes--but no arterial insufficiency  Psychiatric: She has a normal mood and affect. Her behavior is normal.          Assessment & Plan:

## 2016-02-05 NOTE — Assessment & Plan Note (Signed)
Limits her and some anhedonia Prefers not to have much social interaction Gets along with a little help

## 2016-02-05 NOTE — Assessment & Plan Note (Signed)
Found on CT scan No clear symptoms On ASA

## 2016-02-05 NOTE — Assessment & Plan Note (Signed)
Has DNR 

## 2016-02-05 NOTE — Assessment & Plan Note (Signed)
Has some leg pain but doesn't seem vascular No skin issues

## 2016-02-05 NOTE — Assessment & Plan Note (Signed)
I have personally reviewed the Medicare Annual Wellness questionnaire and have noted 1. The patient's medical and social history 2. Their use of alcohol, tobacco or illicit drugs 3. Their current medications and supplements 4. The patient's functional ability including ADL's, fall risks, home safety risks and hearing or visual             impairment. 5. Diet and physical activities 6. Evidence for depression or mood disorders  The patients weight, height, BMI and visual acuity have been recorded in the chart I have made referrals, counseling and provided education to the patient based review of the above and I have provided the pt with a written personalized care plan for preventive services.  I have provided you with a copy of your personalized plan for preventive services. Please take the time to review along with your updated medication list.  Will give flu vaccine No cancer screening due to age

## 2016-02-05 NOTE — Progress Notes (Signed)
Pre visit review using our clinic review tool, if applicable. No additional management support is needed unless otherwise documented below in the visit note. 

## 2016-02-05 NOTE — Assessment & Plan Note (Signed)
Does okay with the tramadol--may need it more that just at night

## 2016-03-06 ENCOUNTER — Other Ambulatory Visit: Payer: Self-pay | Admitting: Internal Medicine

## 2016-03-07 ENCOUNTER — Other Ambulatory Visit: Payer: Self-pay

## 2016-03-07 MED ORDER — TRAMADOL HCL 50 MG PO TABS: ORAL_TABLET | ORAL | 0 refills | Status: DC

## 2016-03-07 NOTE — Telephone Encounter (Signed)
Last filled 08-22-15 #120 Last OV 02-05-16 Next OV 02-09-17

## 2016-03-07 NOTE — Telephone Encounter (Signed)
Prescription corrected and verbal refill given to Tracy Matthews at the pharmacy

## 2016-03-07 NOTE — Telephone Encounter (Signed)
Approved: #90 x 0 Change Rx to tid prn

## 2016-03-12 ENCOUNTER — Ambulatory Visit (INDEPENDENT_AMBULATORY_CARE_PROVIDER_SITE_OTHER): Payer: Medicare Other | Admitting: Podiatry

## 2016-03-12 DIAGNOSIS — M79676 Pain in unspecified toe(s): Secondary | ICD-10-CM

## 2016-03-12 DIAGNOSIS — B351 Tinea unguium: Secondary | ICD-10-CM | POA: Diagnosis not present

## 2016-03-12 NOTE — Progress Notes (Signed)
She presents today chief complaint of painful elongated toenails 1 through 5 bilateral.  Objective: Toenails are thick yellow dystrophic ligament mycotic and painful palpation.  Assessment: Pain limbs he noted onychomycosis.  Plan: Debridement of toenails 1 through 5 bilateral. Follow up with her in 3 months.

## 2016-03-28 DIAGNOSIS — H353134 Nonexudative age-related macular degeneration, bilateral, advanced atrophic with subfoveal involvement: Secondary | ICD-10-CM | POA: Diagnosis not present

## 2016-04-22 ENCOUNTER — Telehealth: Payer: Self-pay | Admitting: Internal Medicine

## 2016-04-22 NOTE — Telephone Encounter (Signed)
This Probation officer spoke with patient at length regarding her symptoms.  She has a long hx of spinal compression fx and deals with intermittent flare ups with pain. About 1 week ago (after twisting wrong in bed) pain began to flare up.  She has been using her tramadol with little relief and today started taking aleve 1 po bid and Tylenol 500mg  for breakthrough pain.    Pain level today is a little better on aleve regimen and will begin alternating heat/ice for comfort but would like to follow up with Dr. Silvio Pate to be sure nothing further is needed.  She denies any immediate distress and can tend to all of her ADL needs.  She is aware that a respite stay is available in the healthcare building of her facility if she needs further assistance.   Patient verbalizes understanding but states she is perfectly okay to wait until scheduled appointment to see Dr. Silvio Pate tomorrow 12/6 at 1115 here in office, she will arrange her transportation.

## 2016-04-22 NOTE — Telephone Encounter (Signed)
[facility] Patient Name: Tracy Matthews DOB: [birth year] Initial Comment lower back pain more to the right, been going on for about a week, same area where she had a fractured vertebrae, when calling it takes her a bit to move to get to the phone, she is in bed. lives at [facility], has no transportation Nurse Assessment Nurse: Markus Daft, RN, Sherre Poot Date/Time (Eastern Time): 04/22/2016 9:16:08 AM Confirm and document reason for call. If symptomatic, describe symptoms. ---Caller states that c/o lower back pain more to the right, been going on for about a week. Rates 10/10. The same area where she had a fractured vertebrae. Does the patient have any new or worsening symptoms? ---Yes Will a triage be completed? ---Yes Related visit to physician within the last 2 weeks? ---No Does the PT have any chronic conditions? (i.e. diabetes, asthma, etc.) ---Yes List chronic conditions. ---fractured vertebrae - on Tramadol (not taken today, had Aleve at 6 am today) Is this a behavioral health or substance abuse call? ---No Guidelines Guideline Title Affirmed Question Affirmed Notes Back Pain [1] SEVERE back pain (e.g., excruciating) AND [2] sudden onset AND [3] age > 50 Final Disposition User Go to ED Now Markus Daft, RN, Sherre Poot Comments Pt does not want to go to ED. She was hoping that Dr. Silvio Pate could see her when he comes to [facility]. RN notified Leafy Ro, nsg mgr of ED outcome. Referrals GO TO FACILITY REFUSED Disagree/Comply: Comply

## 2016-04-22 NOTE — Telephone Encounter (Signed)
Discussed with Leafy Ro I do not have time to make a home visit Can add on at 12:45--or she may need to consider going to health care for respite (and I could see her there--probably Thursday morning)

## 2016-04-22 NOTE — Telephone Encounter (Signed)
Okay---will see her then 

## 2016-04-23 ENCOUNTER — Ambulatory Visit (INDEPENDENT_AMBULATORY_CARE_PROVIDER_SITE_OTHER)
Admission: RE | Admit: 2016-04-23 | Discharge: 2016-04-23 | Disposition: A | Discharge status: Home / Self Care | Payer: Medicare Other | Source: Ambulatory Visit | Attending: Internal Medicine | Admitting: Internal Medicine

## 2016-04-23 ENCOUNTER — Ambulatory Visit (INDEPENDENT_AMBULATORY_CARE_PROVIDER_SITE_OTHER): Payer: Medicare Other | Admitting: Internal Medicine

## 2016-04-23 ENCOUNTER — Encounter: Payer: Self-pay | Admitting: Internal Medicine

## 2016-04-23 VITALS — BP 126/84 | HR 63 | Temp 97.3°F | Wt 160.0 lb

## 2016-04-23 DIAGNOSIS — M545 Low back pain, unspecified: Secondary | ICD-10-CM | POA: Insufficient documentation

## 2016-04-23 DIAGNOSIS — I25119 Atherosclerotic heart disease of native coronary artery with unspecified angina pectoris: Secondary | ICD-10-CM | POA: Diagnosis not present

## 2016-04-23 NOTE — Assessment & Plan Note (Signed)
Could have minor fracture but was a week ago and still functioning I suggested more aide time but she is resistant to this--also doesn't want AL  Okay to continue aleve 1 bid for now (normal renal function) Tylenol/tramadol prn

## 2016-04-23 NOTE — Patient Instructions (Signed)
You can continue the aleve-- 1 tab twice a day. You can also take the tramadol and tylenol with this.

## 2016-04-23 NOTE — Progress Notes (Signed)
Subjective:    Patient ID: Tracy Matthews, female    DOB: [birth year], 82 y.o.   MRN: [id number]  HPI Here due to worsened back pain  Something happened sometime last week Still having pain from the past time Will lie on bed a lot due to this Getting up to reach walker--she felt a "twinge" and had increased pain Reminds her of pain with past fracture  Has been taking aleve-- 1 tab bid This has helped more than the tramadol Also tried tylenol  Current Outpatient Prescriptions on File Prior to Visit  Medication Sig Dispense Refill  . acetaminophen (TYLENOL) 500 MG tablet Take 500 mg by mouth every 6 (six) hours as needed.    Marland Kitchen aspirin 81 MG tablet Take 81 mg by mouth daily.      Marland Kitchen levothyroxine (SYNTHROID, LEVOTHROID) 125 MCG tablet TAKE ONE TABLET EVERY DAY BEFORE BREAKFAST 90 tablet 3  . nitroGLYCERIN (NITROSTAT) 0.4 MG SL tablet Place 1 tablet (0.4 mg total) under the tongue every 5 (five) minutes as needed for chest pain. 25 tablet 1  . polyethylene glycol powder (GLYCOLAX/MIRALAX) powder TAKE 17GM (LINE INSIDE CAP) IN EIGHT OUNCES OF WATER OR OTHER LIQUID DAILY 527 g 11  . traMADol (ULTRAM) 50 MG tablet TAKE ONE TABLET THREE TIMES A DAY IF NEEDED FOR PAIN 90 tablet 0   No current facility-administered medications on file prior to visit.     No Known Allergies  Past Medical History:  Diagnosis Date  . Aortic atherosclerosis ([place])   . Bladder cancer [facility])    Dr Bernardo Heater  . Diverticulosis of colon 1996   on sig  . Hyperlipidemia   . Legal blindness   . Macular degeneration    legally blind  . Osteoporosis   . PVD (peripheral vascular disease) ([place])   . 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 Unspecified hypothyroidism     Past Surgical History:  Procedure Laterality Date  . BLADDER SURGERY  [phone]   bladder cancer removed- [facility], then [facility]  . CATARACT EXTRACTION      Family History  Problem Relation Age of Onset    . Other Mother     enlarged heart  . Diabetes      strong in family  . Heart attack Brother 51  . Diabetes Brother   . Cancer Brother     bladder ca  . Hypertension Neg Hx   . Breast cancer Neg Hx   . Colon cancer Neg Hx     Social History   Social History  . Marital status: Widowed    Spouse name: N/A  . Number of children: 0  . Years of education: N/A   Occupational History  . Not on file.   Social History Main Topics  . Smoking status: Former Research scientist (life sciences)  . Smokeless tobacco: Never Used  . Alcohol use No  . Drug use: No  . Sexual activity: Not on file   Other Topics Concern  . Not on file   Social History Narrative   Widowed 11/08   No children   Doesn't drive   Has living will. Has DNR form already- wants this continued.    Request husbands niece [facility] or [place] as health care POA   Wouldn't want feeding tube   Review of Systems Lives alone--no family. Currently not interested in assisted living Appetite is okay Still does her own personal care-- has weekly caregiver (shower then). Sponge  bath otherwise Otherwise handles her own food, etc    Objective:   Physical Exam  Musculoskeletal:  Tenderness around L5 and lateral to that--to right Right hip ROM is fairly good  Neurological:  Independent in and out of chair Able to walk with rollator           Assessment & Plan:

## 2016-04-23 NOTE — Progress Notes (Signed)
Pre visit review using our clinic review tool, if applicable. No additional management support is needed unless otherwise documented below in the visit note. 

## 2016-04-24 ENCOUNTER — Telehealth: Payer: Self-pay | Admitting: Internal Medicine

## 2016-04-24 NOTE — Telephone Encounter (Signed)
Called checking on her xray results from yesterday

## 2016-04-24 NOTE — Telephone Encounter (Signed)
See note with the x-ray report and call her please

## 2016-04-25 ENCOUNTER — Telehealth: Payer: Self-pay

## 2016-04-25 DIAGNOSIS — M545 Low back pain, unspecified: Secondary | ICD-10-CM

## 2016-04-25 NOTE — Telephone Encounter (Signed)
Tracy Matthews pts niece (do not see DPR signed) left v/m; pt was seen 04/23/16; pt continues with back and lower abdominal pain; pt is very uncomfortable and scared. Sunday Spillers wondered if pt could get different med or see specialist or what ever Dr Silvio Pate would recommend.

## 2016-04-25 NOTE — Telephone Encounter (Signed)
Referral to ortho made--I think her pain is related to the vertebral fracture (though likely chronic)

## 2016-05-09 DIAGNOSIS — M533 Sacrococcygeal disorders, not elsewhere classified: Secondary | ICD-10-CM | POA: Diagnosis not present

## 2016-05-09 DIAGNOSIS — M5136 Other intervertebral disc degeneration, lumbar region: Secondary | ICD-10-CM | POA: Diagnosis not present

## 2016-05-13 ENCOUNTER — Other Ambulatory Visit: Payer: Self-pay | Admitting: Internal Medicine

## 2016-05-13 NOTE — Telephone Encounter (Signed)
Approved: #90 x 0 

## 2016-05-13 NOTE — Telephone Encounter (Signed)
Last Filled 03-07-16 #90 Last OV 04-23-16 Next OV 02-09-17

## 2016-05-13 NOTE — Telephone Encounter (Signed)
Verbal refill given to Amy at the pharmacy 

## 2016-06-11 DIAGNOSIS — M461 Sacroiliitis, not elsewhere classified: Secondary | ICD-10-CM | POA: Diagnosis not present

## 2016-06-18 ENCOUNTER — Encounter: Payer: Self-pay | Admitting: Podiatry

## 2016-06-18 ENCOUNTER — Ambulatory Visit (INDEPENDENT_AMBULATORY_CARE_PROVIDER_SITE_OTHER): Payer: Medicare Other | Admitting: Podiatry

## 2016-06-18 DIAGNOSIS — B351 Tinea unguium: Secondary | ICD-10-CM | POA: Diagnosis not present

## 2016-06-18 DIAGNOSIS — M79676 Pain in unspecified toe(s): Secondary | ICD-10-CM

## 2016-06-18 NOTE — Progress Notes (Signed)
She presents today chief complaint of painful elongated toenails 1 through 5 bilateral.  Objective: Toenails are long thick yellow dystrophic with mycotic. Pulses are intact. No open lesions or wounds.  Assessment: Pain limb segment onychomycosis.  Plan: Debridement toenails 1 through 5 bilateral.

## 2016-06-23 ENCOUNTER — Other Ambulatory Visit: Payer: Self-pay | Admitting: Internal Medicine

## 2016-06-24 NOTE — Telephone Encounter (Signed)
Approved: #90 x 0 

## 2016-06-24 NOTE — Telephone Encounter (Signed)
Verbal refill given to Kennyth Lose at the pharmacy.

## 2016-06-24 NOTE — Telephone Encounter (Signed)
Last filled 05-13-16 #90 Last OV Acute 04-23-16 Next OV 02-09-17

## 2016-06-29 IMAGING — CT CT ABD-PELV W/ CM
2 of 5 series · 16 of 46 positions shown, 18 images · IV contrast (Omni 300)
Comparison: None.

CLINICAL DATA: [AGE] female with abdominal pain. Abdominal
and back pain with constipation for 1 week.

EXAM:
CT ABDOMEN AND PELVIS WITH CONTRAST
TECHNIQUE: Multidetector CT imaging of the abdomen and pelvis was performed
using the standard protocol following bolus administration of
intravenous contrast.
CONTRAST:  100mL OMNIPAQUE IOHEXOL 300 MG/ML  SOLN

[Series 2: abd/ pelvis 5.0 i30f 1 · axial · 0.80mm/px · z∈[+1084,+1489]mm · 13 of 91 slices shown, 15 images]
[im 5/91  soft-tissue]
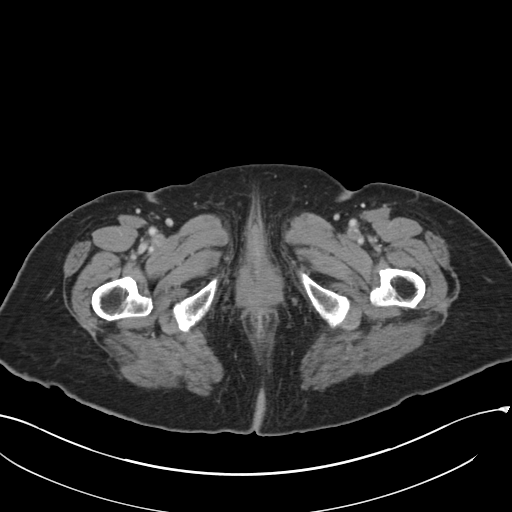
[im 5/91  bone]
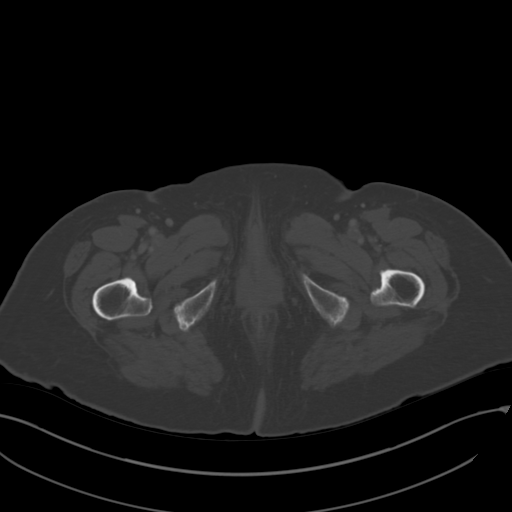
[im 15/91  soft-tissue]
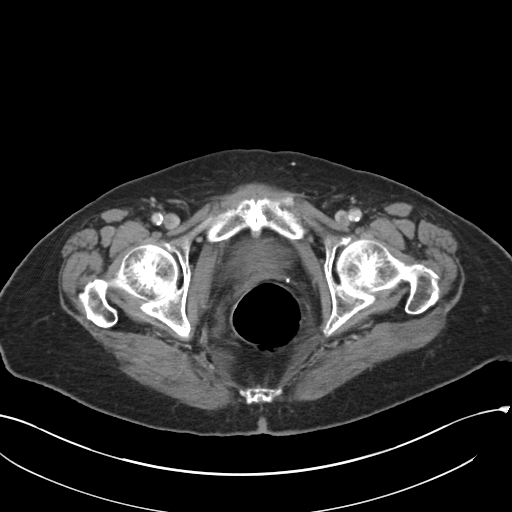
[im 19/91  soft-tissue]
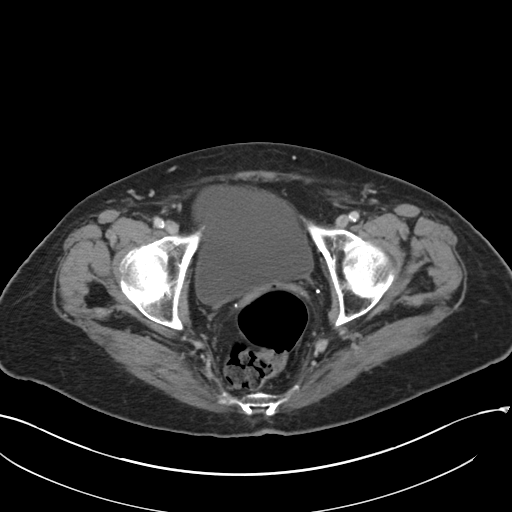
[im 24/91  soft-tissue]
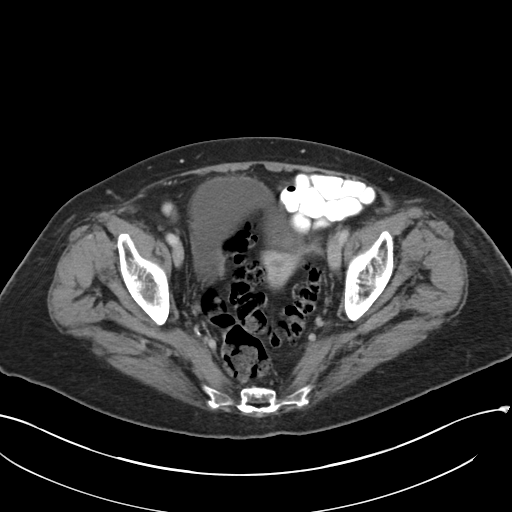
[im 34/91  soft-tissue]
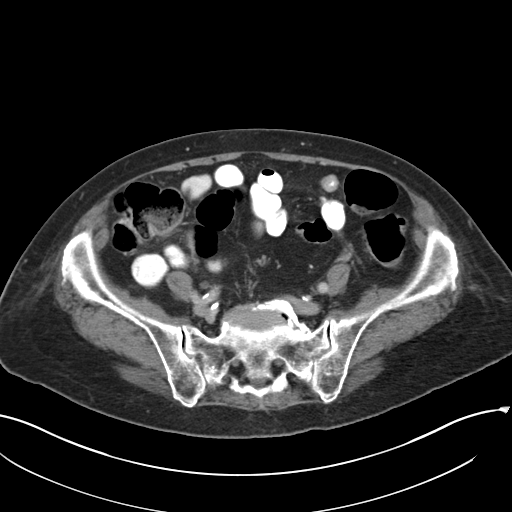
[im 38/91  soft-tissue]
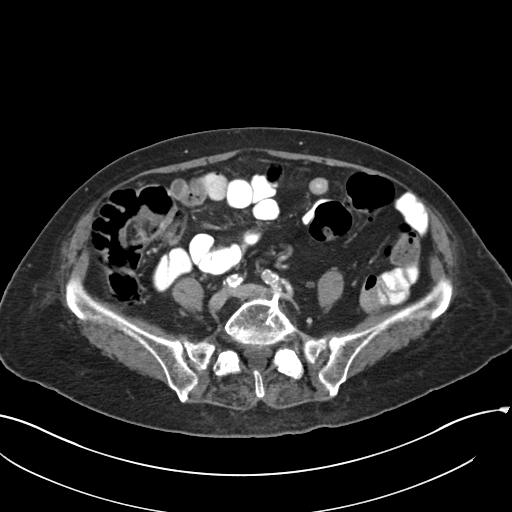
[im 48/91  soft-tissue]
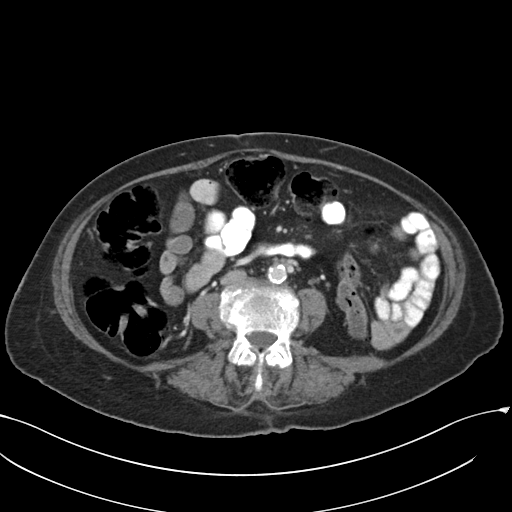
[im 53/91  soft-tissue]
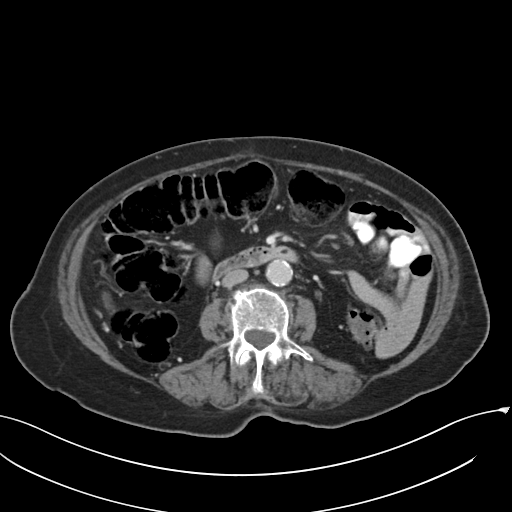
[im 57/91  soft-tissue]
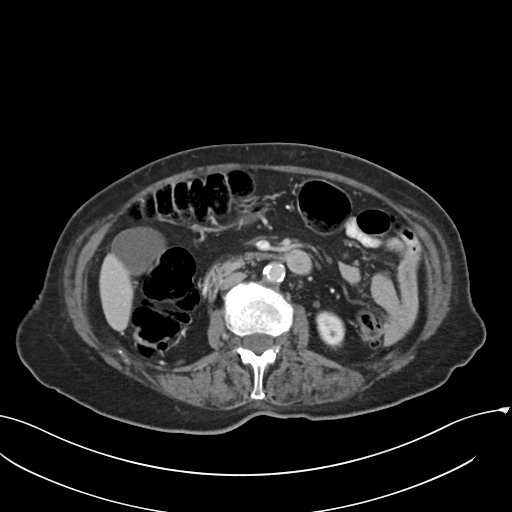
[im 57/91  bone]
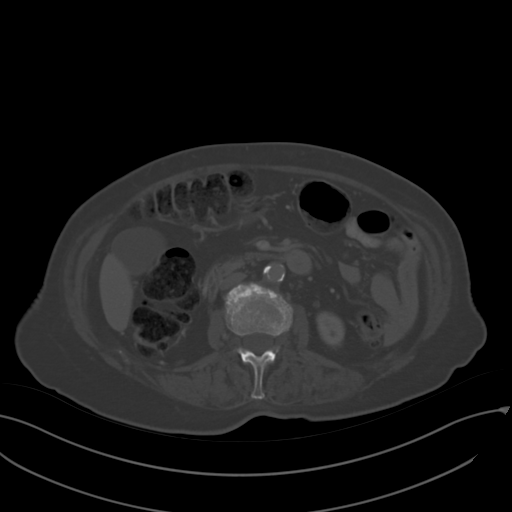
[im 67/91  soft-tissue]
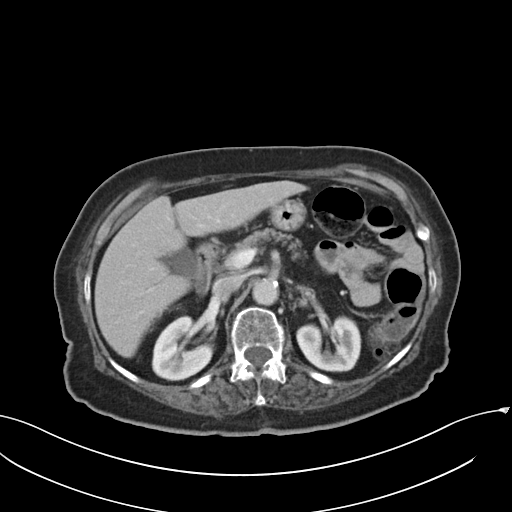
[im 72/91  soft-tissue]
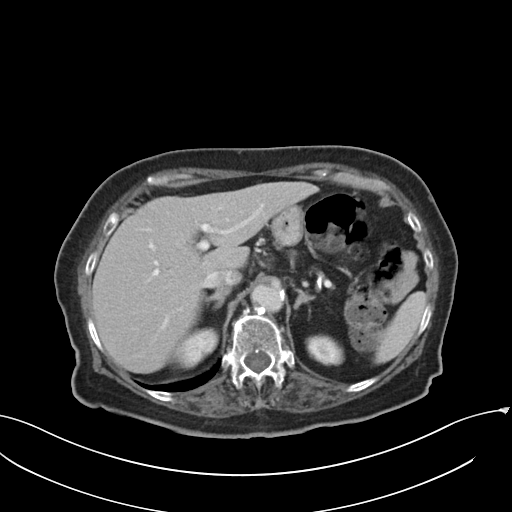
[im 76/91  soft-tissue]
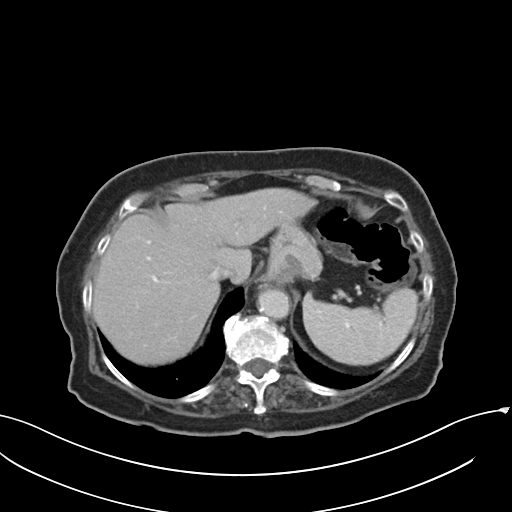
[im 86/91  soft-tissue]
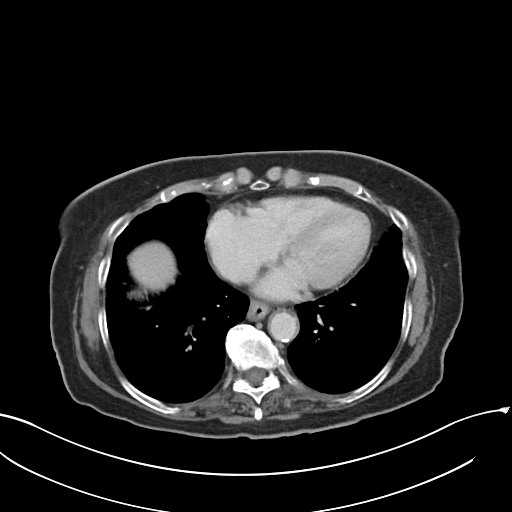

[Series 5: coronals · coronal · 0.70mm/px · 3 of 114 slices shown]
[im 38/114  soft-tissue]
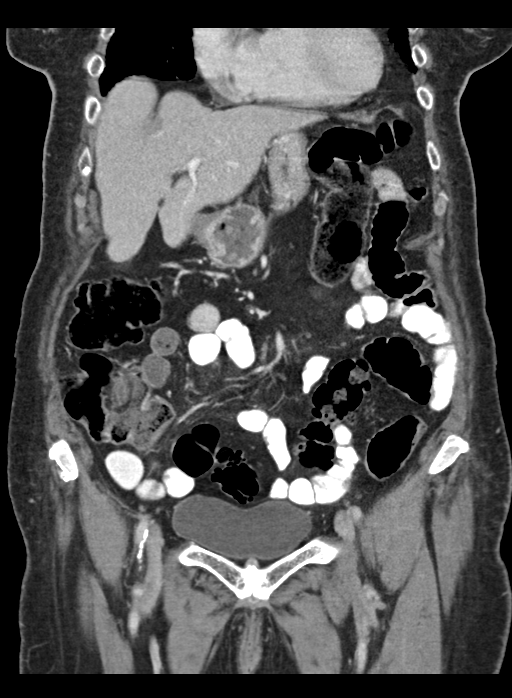
[im 51/114  soft-tissue]
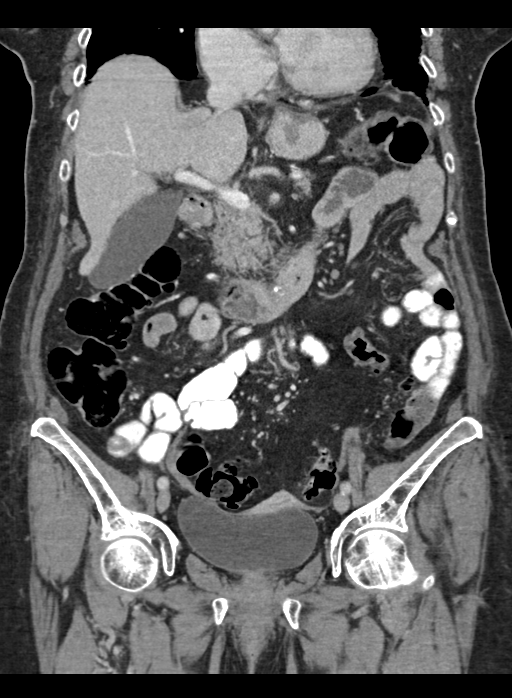
[im 63/114  soft-tissue]
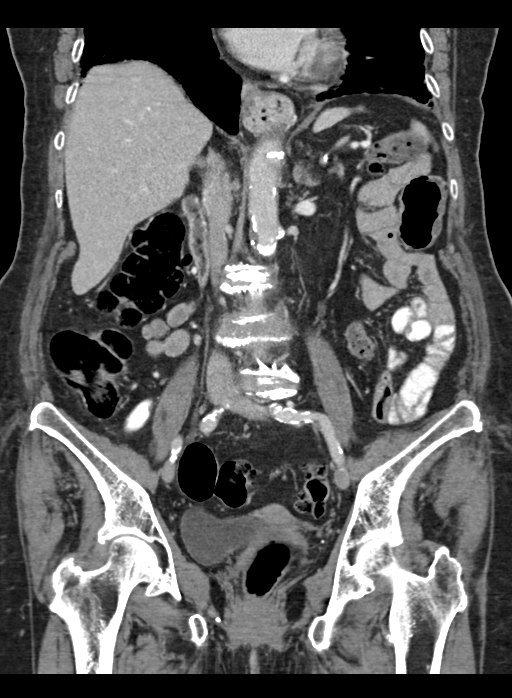

[16 of 46 positions shown; findings below may reference images not displayed]

FINDINGS: Subpleural reticulation in both lung bases. No consolidation or
pleural effusion.

There are no focal hepatic lesions. The gallbladder is borderline
distended. The spleen and adrenal glands are normal. Pancreatic
atrophy without ductal dilatation or surrounding inflammatory
change. Kidneys demonstrate symmetric enhancement and excretion.
There is no hydronephrosis or perinephric stranding.

Moderate hiatal hernia. Stomach is decompressed. There are no
dilated or thickened bowel loops. Moderate volume of colonic stool.
There is diverticulosis from the splenic flexure through the sigmoid
colon without diverticulitis. The appendix is not definitively
identified.

Abdominal aorta is normal in caliber with dense atherosclerosis. No
retroperitoneal adenopathy. Small mesenteric lymph nodes without
overt adenopathy.

Within the pelvis the bladder is physiologically distended. The
uterus is atrophic, normal for age. There is no adnexal mass. No
pelvic free fluid. No pelvic adenopathy.

There are no acute or suspicious osseous abnormalities. Diffuse
degenerative change.
IMPRESSION: 1. Gallbladder appears borderline distended, however no signs of
gallbladder inflammation. Right upper quadrant ultrasound
recommended if there is concern for hepatobiliary pathology.
2. Moderate volume of colonic stool, suggesting constipation.
Scattered colonic diverticulosis, no diverticulitis.
3. Incidental findings of atherosclerosis and moderate hiatal
hernia.

## 2016-07-09 ENCOUNTER — Other Ambulatory Visit: Payer: Self-pay | Admitting: Orthopedic Surgery

## 2016-07-09 DIAGNOSIS — M533 Sacrococcygeal disorders, not elsewhere classified: Secondary | ICD-10-CM

## 2016-07-28 ENCOUNTER — Ambulatory Visit: Payer: Medicare Other

## 2016-08-11 ENCOUNTER — Ambulatory Visit
Admission: RE | Admit: 2016-08-11 | Discharge: 2016-08-11 | Disposition: A | Discharge status: Home / Self Care | Payer: Medicare Other | Source: Ambulatory Visit | Attending: Orthopedic Surgery | Admitting: Orthopedic Surgery

## 2016-08-11 DIAGNOSIS — M5126 Other intervertebral disc displacement, lumbar region: Secondary | ICD-10-CM | POA: Insufficient documentation

## 2016-08-11 DIAGNOSIS — K5730 Diverticulosis of large intestine without perforation or abscess without bleeding: Secondary | ICD-10-CM | POA: Diagnosis not present

## 2016-08-11 DIAGNOSIS — M4856XA Collapsed vertebra, not elsewhere classified, lumbar region, initial encounter for fracture: Secondary | ICD-10-CM | POA: Diagnosis not present

## 2016-08-11 DIAGNOSIS — M533 Sacrococcygeal disorders, not elsewhere classified: Secondary | ICD-10-CM | POA: Diagnosis not present

## 2016-08-18 ENCOUNTER — Other Ambulatory Visit: Payer: Self-pay | Admitting: Internal Medicine

## 2016-08-18 NOTE — Telephone Encounter (Signed)
Approved: #90 x 0 

## 2016-08-18 NOTE — Telephone Encounter (Signed)
Last filled 06-24-16 Last OV 04-23-16 Next OV 02-09-17

## 2016-08-18 NOTE — Telephone Encounter (Signed)
Verbal refill given to Toni at the pharmacy 

## 2016-09-08 ENCOUNTER — Other Ambulatory Visit: Payer: Medicare Other

## 2016-09-08 ENCOUNTER — Ambulatory Visit
Admission: RE | Admit: 2016-09-08 | Discharge: 2016-09-08 | Disposition: A | Discharge status: Home / Self Care | Payer: Medicare Other | Source: Ambulatory Visit | Attending: Orthopedic Surgery | Admitting: Orthopedic Surgery

## 2016-09-08 DIAGNOSIS — I252 Old myocardial infarction: Secondary | ICD-10-CM | POA: Insufficient documentation

## 2016-09-08 DIAGNOSIS — M170 Bilateral primary osteoarthritis of knee: Secondary | ICD-10-CM | POA: Diagnosis not present

## 2016-09-08 DIAGNOSIS — K5730 Diverticulosis of large intestine without perforation or abscess without bleeding: Secondary | ICD-10-CM | POA: Insufficient documentation

## 2016-09-08 DIAGNOSIS — I739 Peripheral vascular disease, unspecified: Secondary | ICD-10-CM | POA: Diagnosis not present

## 2016-09-08 DIAGNOSIS — Z01812 Encounter for preprocedural laboratory examination: Secondary | ICD-10-CM | POA: Diagnosis not present

## 2016-09-08 DIAGNOSIS — I491 Atrial premature depolarization: Secondary | ICD-10-CM | POA: Diagnosis not present

## 2016-09-08 DIAGNOSIS — I700 Atherosclerosis of aorta: Secondary | ICD-10-CM | POA: Diagnosis not present

## 2016-09-08 DIAGNOSIS — H548 Legal blindness, as defined in USA: Secondary | ICD-10-CM | POA: Insufficient documentation

## 2016-09-08 DIAGNOSIS — M810 Age-related osteoporosis without current pathological fracture: Secondary | ICD-10-CM | POA: Diagnosis not present

## 2016-09-08 DIAGNOSIS — M545 Low back pain: Secondary | ICD-10-CM | POA: Diagnosis not present

## 2016-09-08 DIAGNOSIS — Z01810 Encounter for preprocedural cardiovascular examination: Secondary | ICD-10-CM | POA: Insufficient documentation

## 2016-09-08 DIAGNOSIS — E039 Hypothyroidism, unspecified: Secondary | ICD-10-CM | POA: Diagnosis not present

## 2016-09-08 HISTORY — DX: Pain, unspecified: R52

## 2016-09-08 LAB — BASIC METABOLIC PANEL
Anion gap: 7 (ref 5–15)
BUN: 20 mg/dL (ref 6–20)
CALCIUM: 9.1 mg/dL (ref 8.9–10.3)
CO2: 27 mmol/L (ref 22–32)
Chloride: 103 mmol/L (ref 101–111)
Creatinine, Ser: 0.9 mg/dL (ref 0.44–1.00)
GFR calc Af Amer: >60 mL/min (ref >60)
GFR calc non Af Amer: 54 mL/min — ABNORMAL LOW (ref >60)
Glucose, Bld: 136 mg/dL — ABNORMAL HIGH (ref 65–99)
Potassium: 4.1 mmol/L (ref 3.5–5.1)
Sodium: 137 mmol/L (ref 135–145)

## 2016-09-08 LAB — CBC
HEMATOCRIT: 39.6 % (ref 35.0–47.0)
Hemoglobin: 13.1 g/dL (ref 12.0–16.0)
MCH: 30.2 pg (ref 26.0–34.0)
MCHC: 33 g/dL (ref 32.0–36.0)
MCV: 91.7 fL (ref 80.0–100.0)
PLATELETS: 176 10*3/uL (ref 150–440)
RBC: 4.32 MIL/uL (ref 3.80–5.20)
RDW: 15.3 % — AB (ref 11.5–14.5)
WBC: 5.4 10*3/uL (ref 3.6–11.0)

## 2016-09-08 NOTE — Patient Instructions (Signed)
  Your procedure is scheduled on: 09/18/16 Report to Day Surgery. [facility] SECOND FLOOR To find out your arrival time please call [phone] between 1PM - 3PM on 09/17/16  Remember: Instructions that are not followed completely may result in serious medical risk, up to and including death, or upon the discretion of your surgeon and anesthesiologist your surgery may need to be rescheduled.    __X__ 1. Do not eat food or drink liquids after midnight. No gum chewing or hard candies.     ____ 2. No Alcohol for 24 hours before or after surgery.   ____ 3. Do Not Smoke For 24 Hours Prior to Your Surgery.   ____ 4. Bring all medications with you on the day of surgery if instructed.    __X__ 5. Notify your doctor if there is any change in your medical condition     (cold, fever, infections).       Do not wear jewelry, make-up, hairpins, clips or nail polish.  Do not wear lotions, powders, or perfumes. You may wear deodorant.  Do not shave 48 hours prior to surgery. Men may shave face and neck.  Do not bring valuables to the hospital.    [facility] is not responsible for any belongings or valuables.               Contacts, dentures or bridgework may not be worn into surgery.  Leave your suitcase in the car. After surgery it may be brought to your room.  For patients admitted to the hospital, discharge time is determined by your                treatment team.   Patients discharged the day of surgery will not be allowed to drive home.   Please read over the following fact sheets that you were given:   Surgical Site Infection Prevention   _X___ Take these medicines the morning of surgery with A SIP OF WATER:    1. LEVOTHYROXINE  2.   3.   4.  5.  6.  ____ Fleet Enema (as directed)   __X__ Use CHG Soap as directed      FAMILY WILL HELP WITH BATH  ____ Use inhalers on the day of surgery  ____ Stop metformin 2 days prior to surgery    ____ Take 1/2 of usual insulin dose the  night before surgery and none on the morning of surgery.   ____ Stop Coumadin/Plavix/aspirin on   ____ Stop Anti-inflammatories on    ____ Stop supplements until after surgery.    ____ Bring C-Pap to the hospital.

## 2016-09-08 NOTE — Pre-Procedure Instructions (Addendum)
REQUEST FOR OPTIMIZATION/EKG,AS INSTRUCTED BY DR PISCITELLO, CALLED AND FAXED TO CARRIE AT DR [place]. ALSO SPOKE WITH CASEY AT DR [facility]

## 2016-09-10 ENCOUNTER — Ambulatory Visit: Payer: Self-pay | Admitting: Podiatry

## 2016-09-11 NOTE — Pre-Procedure Instructions (Signed)
SPOKE WITH CARRIE AT DR LETVAK'S RE CLEARANCE. STATES PATIENT TO BE SEEN 09/17/16 BY DR LETVAX SO HAD NOT BEEN CLEARED. NOTIFIED CASEY AT DR [facility]

## 2016-09-17 ENCOUNTER — Encounter: Payer: Self-pay | Admitting: Internal Medicine

## 2016-09-17 ENCOUNTER — Ambulatory Visit (INDEPENDENT_AMBULATORY_CARE_PROVIDER_SITE_OTHER): Payer: Medicare Other | Admitting: Internal Medicine

## 2016-09-17 VITALS — BP 160/72 | HR 69 | Temp 97.5°F | Wt 156.0 lb

## 2016-09-17 DIAGNOSIS — Z01818 Encounter for other preprocedural examination: Secondary | ICD-10-CM

## 2016-09-17 MED ORDER — OMEPRAZOLE 20 MG PO CPDR: 20.0000 mg | DELAYED_RELEASE_CAPSULE | Freq: Every day | ORAL | 3 refills | Status: AC

## 2016-09-17 NOTE — Progress Notes (Signed)
Subjective:    Patient ID: Tracy Matthews, female    DOB: 1921/10/20, 83 y.o.   MRN: [id number]  HPI Here with niece for medical clearance for L4 kyphoplasty planned tomorrow by Dr Rudene Christians  Has been having ongoing back pain Really noted a difference in December after the visit with me New L4 compression fracture found on MRI Okay when sitting Severe pain with even brief standing  Better with walking--but very limited  Is planning to go home the day of the procedure Will have aide for 24 hours Normally has aide each Saturday (gets good bath then)  No chest pain--but does have some nausea No set heartburn No dysphagia Uses aleve only prn Has been using the tramadol and tylenol more No SOB No palpitations No edema  Current Outpatient Prescriptions on File Prior to Visit  Medication Sig Dispense Refill  . acetaminophen (TYLENOL) 500 MG tablet Take 500 mg by mouth every 6 (six) hours as needed.    Marland Kitchen aspirin 81 MG tablet Take 81 mg by mouth daily.      Marland Kitchen levothyroxine (SYNTHROID, LEVOTHROID) 125 MCG tablet TAKE ONE TABLET EVERY DAY BEFORE BREAKFAST 90 tablet 3  . nitroGLYCERIN (NITROSTAT) 0.4 MG SL tablet Place 1 tablet (0.4 mg total) under the tongue every 5 (five) minutes as needed for chest pain. 25 tablet 1  . polyethylene glycol powder (GLYCOLAX/MIRALAX) powder TAKE 17GM (LINE INSIDE CAP) IN EIGHT OUNCES OF WATER OR OTHER LIQUID DAILY 527 g 11  . traMADol (ULTRAM) 50 MG tablet TAKE ONE TABLET THREE TIMES A DAY AS NEEDED (Patient taking differently: TAKE ONE TABLET THREE TIMES A DAY AS NEEDED FOR PAIN) 90 tablet 0   No current facility-administered medications on file prior to visit.     No Known Allergies  Past Medical History:  Diagnosis Date  . Aortic atherosclerosis ([place])   . Bladder cancer [facility])    Dr Bernardo Heater  . Diverticulosis of colon 1996   on sig  . Hyperlipidemia   . Legal blindness   . Macular degeneration    legally blind  . Osteoporosis   . Pain    chronic pain left chest x years. has subclavian block left arm. has never used ntg  . PVD (peripheral vascular disease) ([place])   . 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 Unspecified hypothyroidism     Past Surgical History:  Procedure Laterality Date  . BLADDER SURGERY  [phone]   bladder cancer removed- [facility], then [facility]  . CATARACT EXTRACTION    . TONSILLECTOMY      Family History  Problem Relation Age of Onset  . Other Mother     enlarged heart  . Diabetes      strong in family  . Heart attack Brother 63  . Diabetes Brother   . Cancer Brother     bladder ca  . Hypertension Neg Hx   . Breast cancer Neg Hx   . Colon cancer Neg Hx     Social History   Social History  . Marital status: Widowed    Spouse name: N/A  . Number of children: 0  . Years of education: N/A   Occupational History  . Not on file.   Social History Main Topics  . Smoking status: Former Smoker    Quit date: 09/08/1948  . Smokeless tobacco: Never Used  . Alcohol use No  . Drug use: No  . Sexual activity: Not on file  Other Topics Concern  . Not on file   Social History Narrative   Widowed 11/08   No children   Doesn't drive   Has living will. Has DNR form already- wants this continued.    Request husbands niece [facility] or [place] as health care POA   Wouldn't want feeding tube   Review of Systems  Not sleeping great--nothing new Frequent nocturia Appetite is very good Weight is fairly stable     Objective:   Physical Exam  Constitutional: She appears well-nourished. No distress.  Neck: No thyromegaly present.  Cardiovascular: Normal rate, regular rhythm and normal heart sounds.  Exam reveals no gallop.   No murmur heard. Pulmonary/Chest: Effort normal and breath sounds normal. No respiratory distress. She has no wheezes. She has no rales.  Abdominal: Soft. Bowel sounds are normal. She exhibits no distension. There is no rebound and  no guarding.  Very slight left side tenderness  Lymphadenopathy:    She has no cervical adenopathy.          Assessment & Plan:

## 2016-09-17 NOTE — Pre-Procedure Instructions (Signed)
CLEARED BY DR Irving Copas 09/17/16. STARTED ON OMEPRAZOLE

## 2016-09-17 NOTE — Patient Instructions (Signed)
Please start the omeprazole today on an empty stomach--it may help your sensation in your chest and nausea. You should take it for at least 2 weeks regularly---- and if your symptoms are gone then, you can change to just as needed (or every other day or so).

## 2016-09-17 NOTE — Progress Notes (Signed)
Pre visit review using our clinic review tool, if applicable. No additional management support is needed unless otherwise documented below in the visit note. 

## 2016-09-17 NOTE — Assessment & Plan Note (Signed)
Has had 4-5 months of increased pain--so it may be worth trying the kyphoplasty. Has some sensation in chest, with nausea----seems more like reflux than cardiac BP up some today--but generally okay Will recommend omeprazole daily (at least for 2 weeks) EKG shows sinus with poor R wave progression---but no sig change from 2016  Okay to proceed with this low risk procedure (as long as BP reasonable tomorrow) No special recommendations for perioperative period otherwise

## 2016-09-18 ENCOUNTER — Encounter
Admission: RE | Disposition: A | Discharge status: Home / Self Care | Payer: Self-pay | Source: Ambulatory Visit | Attending: Orthopedic Surgery

## 2016-09-18 ENCOUNTER — Ambulatory Visit
Admission: RE | Admit: 2016-09-18 | Discharge: 2016-09-18 | Disposition: A | Discharge status: Home / Self Care | Payer: Medicare Other | Source: Ambulatory Visit | Attending: Orthopedic Surgery | Admitting: Orthopedic Surgery

## 2016-09-18 ENCOUNTER — Ambulatory Visit: Payer: Medicare Other | Admitting: Anesthesiology

## 2016-09-18 ENCOUNTER — Ambulatory Visit: Payer: Medicare Other

## 2016-09-18 ENCOUNTER — Encounter: Payer: Self-pay | Admitting: Anesthesiology

## 2016-09-18 DIAGNOSIS — X58XXXA Exposure to other specified factors, initial encounter: Secondary | ICD-10-CM | POA: Insufficient documentation

## 2016-09-18 DIAGNOSIS — Z833 Family history of diabetes mellitus: Secondary | ICD-10-CM | POA: Diagnosis not present

## 2016-09-18 DIAGNOSIS — K5900 Constipation, unspecified: Secondary | ICD-10-CM | POA: Diagnosis not present

## 2016-09-18 DIAGNOSIS — Z8249 Family history of ischemic heart disease and other diseases of the circulatory system: Secondary | ICD-10-CM | POA: Diagnosis not present

## 2016-09-18 DIAGNOSIS — H3530 Unspecified macular degeneration: Secondary | ICD-10-CM | POA: Diagnosis not present

## 2016-09-18 DIAGNOSIS — I739 Peripheral vascular disease, unspecified: Secondary | ICD-10-CM | POA: Diagnosis not present

## 2016-09-18 DIAGNOSIS — Z79899 Other long term (current) drug therapy: Secondary | ICD-10-CM | POA: Diagnosis not present

## 2016-09-18 DIAGNOSIS — Z8052 Family history of malignant neoplasm of bladder: Secondary | ICD-10-CM | POA: Diagnosis not present

## 2016-09-18 DIAGNOSIS — Z7982 Long term (current) use of aspirin: Secondary | ICD-10-CM | POA: Diagnosis not present

## 2016-09-18 DIAGNOSIS — S32040A Wedge compression fracture of fourth lumbar vertebra, initial encounter for closed fracture: Secondary | ICD-10-CM | POA: Insufficient documentation

## 2016-09-18 DIAGNOSIS — E785 Hyperlipidemia, unspecified: Secondary | ICD-10-CM | POA: Insufficient documentation

## 2016-09-18 DIAGNOSIS — M1990 Unspecified osteoarthritis, unspecified site: Secondary | ICD-10-CM | POA: Diagnosis not present

## 2016-09-18 DIAGNOSIS — I871 Compression of vein: Secondary | ICD-10-CM | POA: Diagnosis not present

## 2016-09-18 DIAGNOSIS — Z419 Encounter for procedure for purposes other than remedying health state, unspecified: Secondary | ICD-10-CM

## 2016-09-18 DIAGNOSIS — Z8551 Personal history of malignant neoplasm of bladder: Secondary | ICD-10-CM | POA: Insufficient documentation

## 2016-09-18 DIAGNOSIS — Z4789 Encounter for other orthopedic aftercare: Secondary | ICD-10-CM | POA: Diagnosis not present

## 2016-09-18 DIAGNOSIS — E039 Hypothyroidism, unspecified: Secondary | ICD-10-CM | POA: Diagnosis not present

## 2016-09-18 DIAGNOSIS — M533 Sacrococcygeal disorders, not elsewhere classified: Secondary | ICD-10-CM | POA: Diagnosis not present

## 2016-09-18 DIAGNOSIS — K5790 Diverticulosis of intestine, part unspecified, without perforation or abscess without bleeding: Secondary | ICD-10-CM | POA: Diagnosis not present

## 2016-09-18 DIAGNOSIS — M810 Age-related osteoporosis without current pathological fracture: Secondary | ICD-10-CM | POA: Diagnosis not present

## 2016-09-18 DIAGNOSIS — Z9849 Cataract extraction status, unspecified eye: Secondary | ICD-10-CM | POA: Insufficient documentation

## 2016-09-18 DIAGNOSIS — Z87891 Personal history of nicotine dependence: Secondary | ICD-10-CM | POA: Insufficient documentation

## 2016-09-18 DIAGNOSIS — I700 Atherosclerosis of aorta: Secondary | ICD-10-CM | POA: Insufficient documentation

## 2016-09-18 HISTORY — PX: KYPHOPLASTY: SHX5884

## 2016-09-18 SURGERY — KYPHOPLASTY
Anesthesia: General | Site: Spine Lumbar | Wound class: Clean

## 2016-09-18 MED ORDER — METOCLOPRAMIDE HCL 5 MG/ML IJ SOLN: 5.0000 mg | Freq: Three times a day (TID) | INTRAMUSCULAR | Status: DC | PRN

## 2016-09-18 MED ORDER — PROPOFOL 500 MG/50ML IV EMUL
INTRAVENOUS | Status: DC | PRN
  Administered 2016-09-18: 40 ug/kg/min via INTRAVENOUS

## 2016-09-18 MED ORDER — SODIUM CHLORIDE 0.9 % IV SOLN: INTRAVENOUS | Status: DC

## 2016-09-18 MED ORDER — MIDAZOLAM HCL 2 MG/2ML IJ SOLN
INTRAMUSCULAR | Status: AC
  Filled 2016-09-18: qty 2

## 2016-09-18 MED ORDER — ONDANSETRON HCL 4 MG/2ML IJ SOLN
INTRAMUSCULAR | Status: DC | PRN
  Administered 2016-09-18: 4 mg via INTRAVENOUS

## 2016-09-18 MED ORDER — FENTANYL CITRATE (PF) 100 MCG/2ML IJ SOLN
INTRAMUSCULAR | Status: AC
  Administered 2016-09-18: 25 ug via INTRAVENOUS
  Filled 2016-09-18: qty 2

## 2016-09-18 MED ORDER — LIDOCAINE HCL (PF) 1 % IJ SOLN
INTRAMUSCULAR | Status: AC
  Filled 2016-09-18: qty 60

## 2016-09-18 MED ORDER — FAMOTIDINE 20 MG PO TABS
ORAL_TABLET | ORAL | Status: AC
  Administered 2016-09-18: 20 mg via ORAL
  Filled 2016-09-18: qty 1

## 2016-09-18 MED ORDER — IOPAMIDOL (ISOVUE-M 200) INJECTION 41%
INTRAMUSCULAR | Status: AC
  Filled 2016-09-18: qty 10

## 2016-09-18 MED ORDER — CEFAZOLIN SODIUM-DEXTROSE 2-4 GM/100ML-% IV SOLN
2.0000 g | Freq: Once | INTRAVENOUS | Status: AC
  Administered 2016-09-18: 2 g via INTRAVENOUS

## 2016-09-18 MED ORDER — ONDANSETRON HCL 4 MG/2ML IJ SOLN
INTRAMUSCULAR | Status: AC
  Filled 2016-09-18: qty 2

## 2016-09-18 MED ORDER — CEFAZOLIN SODIUM-DEXTROSE 2-4 GM/100ML-% IV SOLN
INTRAVENOUS | Status: AC
  Filled 2016-09-18: qty 100

## 2016-09-18 MED ORDER — LIDOCAINE HCL 1 % IJ SOLN
INTRAMUSCULAR | Status: DC | PRN
  Administered 2016-09-18: 10 mL
  Administered 2016-09-18: 20 mL

## 2016-09-18 MED ORDER — FAMOTIDINE 20 MG PO TABS
20.0000 mg | ORAL_TABLET | Freq: Once | ORAL | Status: AC
  Administered 2016-09-18: 20 mg via ORAL

## 2016-09-18 MED ORDER — ONDANSETRON HCL 4 MG PO TABS: 4.0000 mg | ORAL_TABLET | Freq: Four times a day (QID) | ORAL | Status: DC | PRN

## 2016-09-18 MED ORDER — FENTANYL CITRATE (PF) 100 MCG/2ML IJ SOLN
25.0000 ug | INTRAMUSCULAR | Status: DC | PRN
  Administered 2016-09-18 (×2): 25 ug via INTRAVENOUS

## 2016-09-18 MED ORDER — LACTATED RINGERS IV SOLN
INTRAVENOUS | Status: DC
  Administered 2016-09-18: 11:00:00 via INTRAVENOUS

## 2016-09-18 MED ORDER — LIDOCAINE HCL (PF) 1 % IJ SOLN
INTRAMUSCULAR | Status: AC
  Filled 2016-09-18: qty 2

## 2016-09-18 MED ORDER — BUPIVACAINE-EPINEPHRINE (PF) 0.5% -1:200000 IJ SOLN
INTRAMUSCULAR | Status: DC | PRN
  Administered 2016-09-18: 20 mL

## 2016-09-18 MED ORDER — METOCLOPRAMIDE HCL 10 MG PO TABS: 5.0000 mg | ORAL_TABLET | Freq: Three times a day (TID) | ORAL | Status: DC | PRN

## 2016-09-18 MED ORDER — ONDANSETRON HCL 4 MG/2ML IJ SOLN: 4.0000 mg | Freq: Once | INTRAMUSCULAR | Status: DC | PRN

## 2016-09-18 MED ORDER — ONDANSETRON HCL 4 MG/2ML IJ SOLN: 4.0000 mg | Freq: Four times a day (QID) | INTRAMUSCULAR | Status: DC | PRN

## 2016-09-18 MED ORDER — TRAMADOL HCL 50 MG PO TABS
50.0000 mg | ORAL_TABLET | Freq: Four times a day (QID) | ORAL | Status: DC | PRN
  Filled 2016-09-18: qty 1

## 2016-09-18 MED ORDER — PROPOFOL 500 MG/50ML IV EMUL
INTRAVENOUS | Status: AC
Start: 2016-09-18 — End: 2016-09-18
  Filled 2016-09-18: qty 50

## 2016-09-18 MED ORDER — MIDAZOLAM HCL 2 MG/2ML IJ SOLN
INTRAMUSCULAR | Status: DC | PRN
  Administered 2016-09-18: .5 mg via INTRAVENOUS
  Administered 2016-09-18: 1 mg via INTRAVENOUS
  Administered 2016-09-18: .5 mg via INTRAVENOUS

## 2016-09-18 MED ORDER — IOPAMIDOL (ISOVUE-M 200) INJECTION 41%
INTRAMUSCULAR | Status: AC
  Filled 2016-09-18: qty 20

## 2016-09-18 MED ORDER — BUPIVACAINE-EPINEPHRINE (PF) 0.5% -1:200000 IJ SOLN
INTRAMUSCULAR | Status: AC
  Filled 2016-09-18: qty 30

## 2016-09-18 SURGICAL SUPPLY — 17 items
ADH SKN CLS APL DERMABOND .7 (GAUZE/BANDAGES/DRESSINGS) ×1
CEMENT BONE KYPHON CDS (Cement) ×3 IMPLANT
DERMABOND ADVANCED (GAUZE/BANDAGES/DRESSINGS) ×2
DERMABOND ADVANCED .7 DNX12 (GAUZE/BANDAGES/DRESSINGS) ×1 IMPLANT
DEVICE BIOPSY BONE KYPHX (INSTRUMENTS) ×3 IMPLANT
DRAPE C-ARM XRAY 36X54 (DRAPES) ×3 IMPLANT
DURAPREP 26ML APPLICATOR (WOUND CARE) ×3 IMPLANT
GLOVE SURG SYN 9.0  PF PI (GLOVE) ×2
GLOVE SURG SYN 9.0 PF PI (GLOVE) ×1 IMPLANT
GOWN SRG 2XL LVL 4 RGLN SLV (GOWNS) ×1 IMPLANT
GOWN STRL NON-REIN 2XL LVL4 (GOWNS) ×3
GOWN STRL REUS W/ TWL LRG LVL3 (GOWN DISPOSABLE) ×1 IMPLANT
GOWN STRL REUS W/TWL LRG LVL3 (GOWN DISPOSABLE) ×3
PACK KYPHOPLASTY (MISCELLANEOUS) ×3 IMPLANT
STRAP SAFETY BODY (MISCELLANEOUS) ×3 IMPLANT
TRAY KYPHOPAK 15/3 EXPRESS 1ST (MISCELLANEOUS) ×3 IMPLANT
TRAY KYPHOPAK 20/3 EXPRESS 1ST (MISCELLANEOUS) IMPLANT

## 2016-09-18 NOTE — Anesthesia Procedure Notes (Signed)
Date/Time: 09/18/2016 12:21 PM Performed by: Nelda Marseille Pre-anesthesia Checklist: Patient identified, Emergency Drugs available, Suction available, Patient being monitored and Timeout performed Oxygen Delivery Method: Nasal cannula

## 2016-09-18 NOTE — Anesthesia Post-op Follow-up Note (Cosign Needed)
Anesthesia QCDR form completed.        

## 2016-09-18 NOTE — Anesthesia Preprocedure Evaluation (Addendum)
Anesthesia Evaluation  Patient identified by MRN, date of birth, ID band Patient awake    Reviewed: Allergy & Precautions, NPO status , Patient's Chart, lab work & pertinent test results  Airway Mallampati: III       Dental   Pulmonary former smoker,    Pulmonary exam normal        Cardiovascular + Peripheral Vascular Disease  Normal cardiovascular exam     Neuro/Psych negative neurological ROS  negative psych ROS   GI/Hepatic Neg liver ROS, diverticulosis   Endo/Other  Hypothyroidism   Renal/GU negative Renal ROS  negative genitourinary   Musculoskeletal  (+) Arthritis , Osteoarthritis,    Abdominal Normal abdominal exam  (+)   Peds negative pediatric ROS (+)  Hematology   Anesthesia Other Findings Past Medical History: No date: Aortic atherosclerosis ([facility]) No date: Bladder cancer [facility])     Comment: Dr Bernardo Heater 1996: Diverticulosis of colon     Comment: on sig No date: Hyperlipidemia No date: Legal blindness No date: Macular degeneration     Comment: legally blind No date: Osteoporosis No date: Pain     Comment: chronic pain left chest x years. has               subclavian block left arm. has never used ntg No date: PVD (peripheral vascular disease) ([place]) 1999: Subclavian vein obstruction ([facility])     Comment: Left, no Rx, Dr Rollene Fare No date: Unspecified hypothyroidism  Reproductive/Obstetrics                             Anesthesia Physical Anesthesia Plan  ASA: III  Anesthesia Plan: General   Post-op Pain Management:    Induction: Intravenous  Airway Management Planned: Nasal Cannula  Additional Equipment:   Intra-op Plan:   Post-operative Plan:   Informed Consent: I have reviewed the patients History and Physical, chart, labs and discussed the procedure including the risks, benefits and alternatives for the proposed anesthesia with the patient or authorized  representative who has indicated his/her understanding and acceptance.     Plan Discussed with: CRNA and Surgeon  Anesthesia Plan Comments:         Anesthesia Quick Evaluation

## 2016-09-18 NOTE — Anesthesia Postprocedure Evaluation (Signed)
Anesthesia Post Note  Patient: Tracy Matthews  Procedure(s) Performed: Procedure(s) (LRB): KYPHOPLASTY-L4 (N/A)  Patient location during evaluation: PACU Anesthesia Type: General Level of consciousness: awake and alert and oriented Pain management: pain level controlled Vital Signs Assessment: post-procedure vital signs reviewed and stable Respiratory status: spontaneous breathing Cardiovascular status: blood pressure returned to baseline Anesthetic complications: no     Last Vitals:  Vitals:   09/18/16 1400 09/18/16 1415  BP: (!) 166/61 (!) 144/53  Pulse: 67 66  Resp: 20 18  Temp: 36.1 C     Last Pain:  Vitals:   09/18/16 1400  TempSrc: Temporal  PainSc: 5                  Arra Connaughton

## 2016-09-18 NOTE — Op Note (Signed)
09/18/2016  12:51 PM  PATIENT:  Tracy Matthews  83 y.o. female  PRE-OPERATIVE DIAGNOSIS:  L4 compression fracture  POST-OPERATIVE DIAGNOSIS: Same  PROCEDURE:  Procedure(s): KYPHOPLASTY-L4 (N/A)  SURGEON: Laurene Footman, MD  ASSISTANTS: None  ANESTHESIA:   local and MAC  EBL:  No intake/output data recorded.  BLOOD ADMINISTERED:none  DRAINS: none   LOCAL MEDICATIONS USED:  MARCAINE    and XYLOCAINE   SPECIMEN:  Source of Specimen:  L4 vertebral body  DISPOSITION OF SPECIMEN:  PATHOLOGY  COUNTS:  YES  TOURNIQUET:  * No tourniquets in log *  IMPLANTS: Bone cement  DICTATION: .Dragon Dictation patient brought the operating room and after adequate sedation was given, she is placed prone. C-arm was brought in and good visualization of L4 was obtained in both AP and lateral projections. After patient identification and timeout procedures completed, 10 cc of 1% Xylocaine was infiltrated subcutaneously on both sides of the planned incisions. The back was then prepped and draped in usual sterile manner and repeat timeout procedure carried out. Spinal needle was then used to get local asthenic down to the pedicle on both right and left sides with a 50-50 mix of 1% Xylocaine have percent Sensorcaine with epinephrine. A small incision was made first on the right and a trocar advanced and through a transpedicular approach a biopsy was obtained with care being taken on advancement of the trocar that the neural foramen and spinal canal were not entered. After biopsy was obtained drilling was carried out and a balloon inserted. After with inflation of a balloon it was clear there was crossing over the midline and so second approach was not required. After inflation was complete with about 3 and half cc with partial elevation of the superior endplate on the right side cement was mixed and was the appropriate consistency proximal before have cc of bone cement infiltrated gave very good fill  throughout the vertebral body. When the cement had set the trochars removed and permanent C-arm views were obtained. Dermabond was used to close the skin followed by a Band-Aid.  PLAN OF CARE: Discharge to home after PACU  PATIENT DISPOSITION:  PACU - hemodynamically stable.

## 2016-09-18 NOTE — H&P (Signed)
Reviewed paper H+P, will be scanned into chart. Patient examined No changes noted.  

## 2016-09-18 NOTE — Transfer of Care (Signed)
Immediate Anesthesia Transfer of Care Note  Patient: Tracy Matthews  Procedure(s) Performed: Procedure(s): KYPHOPLASTY-L4 (N/A)  Patient Location: PACU  Anesthesia Type:General  Level of Consciousness: awake, oriented and sedated  Airway & Oxygen Therapy: Patient Spontanous Breathing and Patient connected to nasal cannula oxygen  Post-op Assessment: Report given to RN and Post -op Vital signs reviewed and stable  Post vital signs: Reviewed and stable  Last Vitals:  Vitals:   09/18/16 1057  BP: (!) 148/54  Pulse: 61  Resp: 16  Temp: 36.4 C    Last Pain:  Vitals:   09/18/16 1057  TempSrc: Oral  PainSc: 4          Complications: No apparent anesthesia complications

## 2016-09-18 NOTE — Discharge Instructions (Addendum)
Take it easy today with activities as tolerated starting tomorrow. Remove Band-Aid on Saturday, okay to shower after that and no need to cover incision. Follow up in 2 weeks as previously scheduled.    General Anesthesia, Adult, Care After These instructions provide you with information about caring for yourself after your procedure. Your health care provider may also give you more specific instructions. Your treatment has been planned according to current medical practices, but problems sometimes occur. Call your health care provider if you have any problems or questions after your procedure. What can I expect after the procedure? After the procedure, it is common to have:  Vomiting.  A sore throat.  Mental slowness. It is common to feel:  Nauseous.  Cold or shivery.  Sleepy.  Tired.  Sore or achy, even in parts of your body where you did not have surgery. Follow these instructions at home: For at least 24 hours after the procedure:   Do not:  Participate in activities where you could fall or become injured.  Drive.  Use heavy machinery.  Drink alcohol.  Take sleeping pills or medicines that cause drowsiness.  Make important decisions or sign legal documents.  Take care of children on your own.  Rest. Eating and drinking   If you vomit, drink water, juice, or soup when you can drink without vomiting.  Drink enough fluid to keep your urine clear or pale yellow.  Make sure you have little or no nausea before eating solid foods.  Follow the diet recommended by your health care provider. General instructions   Have a responsible adult stay with you until you are awake and alert.  Return to your normal activities as told by your health care provider. Ask your health care provider what activities are safe for you.  Take over-the-counter and prescription medicines only as told by your health care provider.  If you smoke, do not smoke without  supervision.  Keep all follow-up visits as told by your health care provider. This is important. Contact a health care provider if:  You continue to have nausea or vomiting at home, and medicines are not helpful.  You cannot drink fluids or start eating again.  You cannot urinate after 8-12 hours.  You develop a skin rash.  You have fever.  You have increasing redness at the site of your procedure. Get help right away if:  You have difficulty breathing.  You have chest pain.  You have unexpected bleeding.  You feel that you are having a life-threatening or urgent problem. This information is not intended to replace advice given to you by your health care provider. Make sure you discuss any questions you have with your health care provider. Document Released: 08/11/2000 Document Revised: 10/08/2015 Document Reviewed: 04/19/2015 Elsevier Interactive Patient Education  2017 [employer].

## 2016-09-19 LAB — SURGICAL PATHOLOGY

## 2016-09-22 ENCOUNTER — Ambulatory Visit (INDEPENDENT_AMBULATORY_CARE_PROVIDER_SITE_OTHER): Payer: Medicare Other | Admitting: Podiatry

## 2016-09-22 ENCOUNTER — Encounter: Payer: Self-pay | Admitting: Podiatry

## 2016-09-22 DIAGNOSIS — M79676 Pain in unspecified toe(s): Secondary | ICD-10-CM | POA: Diagnosis not present

## 2016-09-22 DIAGNOSIS — B351 Tinea unguium: Secondary | ICD-10-CM

## 2016-09-22 NOTE — Progress Notes (Signed)
She presents today for chief complaint of painful elongated toenails.  Objective: Vital signs are stable she is alert and oriented 3. Pulses are palpable. Toenails are long thick yellow dystrophic with mycotic. On palpation.  Assessment: Pain and limb centered onychomycosis bilateral.  Plan: Debridement toenails 1 through 5 bilateral.

## 2016-10-02 DIAGNOSIS — S32040A Wedge compression fracture of fourth lumbar vertebra, initial encounter for closed fracture: Secondary | ICD-10-CM | POA: Diagnosis not present

## 2016-10-22 ENCOUNTER — Other Ambulatory Visit: Payer: Self-pay | Admitting: Internal Medicine

## 2016-10-23 NOTE — Telephone Encounter (Signed)
Approved: #90 x 0 

## 2016-10-23 NOTE — Telephone Encounter (Signed)
Last filled 08-18-16 #90 Last OV 09-17-16 Next OV 02-09-17  No UDS

## 2016-10-23 NOTE — Telephone Encounter (Signed)
Verbal refill given to [facility] at the pharmacy.

## 2016-11-27 ENCOUNTER — Other Ambulatory Visit: Payer: Self-pay | Admitting: Internal Medicine

## 2016-12-19 DIAGNOSIS — M461 Sacroiliitis, not elsewhere classified: Secondary | ICD-10-CM | POA: Diagnosis not present

## 2016-12-29 ENCOUNTER — Ambulatory Visit (INDEPENDENT_AMBULATORY_CARE_PROVIDER_SITE_OTHER): Payer: Medicare Other | Admitting: Podiatry

## 2016-12-29 DIAGNOSIS — B351 Tinea unguium: Secondary | ICD-10-CM | POA: Diagnosis not present

## 2016-12-29 DIAGNOSIS — M79676 Pain in unspecified toe(s): Secondary | ICD-10-CM | POA: Diagnosis not present

## 2016-12-29 NOTE — Progress Notes (Signed)
Complaint:  Visit Type: Patient returns to my office for continued preventative foot care services. Complaint: Patient states" my nails have grown long and thick and become painful to walk and wear shoes" . The patient presents for preventative foot care services. No changes to ROS  Podiatric Exam: Vascular: dorsalis pedis and posterior tibial pulses are palpable bilateral. Capillary return is immediate. Temperature gradient is WNL. Skin turgor WNL  Sensorium: Normal Semmes Weinstein monofilament test. Normal tactile sensation bilaterally. Nail Exam: Pt has thick disfigured discolored nails with subungual debris noted bilateral entire nail hallux through fifth toenails Ulcer Exam: There is no evidence of ulcer or pre-ulcerative changes or infection. Orthopedic Exam: Muscle tone and strength are WNL. No limitations in general ROM. No crepitus or effusions noted. Foot type and digits show no abnormalities. Bony prominences are unremarkable. Skin: No Porokeratosis. No infection or ulcers  Diagnosis:  Onychomycosis, , Pain in right toe, pain in left toes  Treatment & Plan Procedures and Treatment: Consent by patient was obtained for treatment procedures. The patient understood the discussion of treatment and procedures well. All questions were answered thoroughly reviewed. Debridement of mycotic and hypertrophic toenails, 1 through 5 bilateral and clearing of subungual debris. No ulceration, no infection noted.  Return Visit-Office Procedure: Patient instructed to return to the office for a follow up visit 3 months   for continued evaluation and treatment.    Jazmarie Biever DPM 

## 2017-02-09 ENCOUNTER — Encounter: Payer: Medicare Other | Admitting: Internal Medicine

## 2017-02-23 ENCOUNTER — Other Ambulatory Visit: Payer: Self-pay | Admitting: Internal Medicine

## 2017-02-25 ENCOUNTER — Encounter: Payer: Self-pay | Admitting: Internal Medicine

## 2017-02-25 ENCOUNTER — Ambulatory Visit (INDEPENDENT_AMBULATORY_CARE_PROVIDER_SITE_OTHER): Payer: Medicare Other | Admitting: Internal Medicine

## 2017-02-25 VITALS — BP 140/86 | HR 65 | Temp 97.8°F | Ht 62.0 in | Wt 160.0 lb

## 2017-02-25 DIAGNOSIS — M545 Low back pain: Secondary | ICD-10-CM

## 2017-02-25 DIAGNOSIS — H548 Legal blindness, as defined in USA: Secondary | ICD-10-CM | POA: Diagnosis not present

## 2017-02-25 DIAGNOSIS — E039 Hypothyroidism, unspecified: Secondary | ICD-10-CM

## 2017-02-25 DIAGNOSIS — Z23 Encounter for immunization: Secondary | ICD-10-CM

## 2017-02-25 DIAGNOSIS — Z0000 Encounter for general adult medical examination without abnormal findings: Secondary | ICD-10-CM

## 2017-02-25 DIAGNOSIS — F39 Unspecified mood [affective] disorder: Secondary | ICD-10-CM | POA: Insufficient documentation

## 2017-02-25 DIAGNOSIS — I700 Atherosclerosis of aorta: Secondary | ICD-10-CM | POA: Diagnosis not present

## 2017-02-25 DIAGNOSIS — G8929 Other chronic pain: Secondary | ICD-10-CM

## 2017-02-25 DIAGNOSIS — Z7189 Other specified counseling: Secondary | ICD-10-CM | POA: Diagnosis not present

## 2017-02-25 LAB — T4, FREE: Free T4: 1.13 ng/dL (ref 0.60–1.60)

## 2017-02-25 LAB — TSH: TSH: 1.2 u[IU]/mL (ref 0.35–4.50)

## 2017-02-25 MED ORDER — LORAZEPAM 0.5 MG PO TABS: 0.2500 mg | ORAL_TABLET | Freq: Every evening | ORAL | 0 refills | Status: DC | PRN

## 2017-02-25 NOTE — Assessment & Plan Note (Signed)
Found on CT No apparent PVD now---no claudication No action on this

## 2017-02-25 NOTE — Assessment & Plan Note (Signed)
Chronic now On tylenol and rare tramadol

## 2017-02-25 NOTE — Assessment & Plan Note (Signed)
Has DNR 

## 2017-02-25 NOTE — Assessment & Plan Note (Signed)
I have personally reviewed the Medicare Annual Wellness questionnaire and have noted 1. The patient's medical and social history 2. Their use of alcohol, tobacco or illicit drugs 3. Their current medications and supplements 4. The patient's functional ability including ADL's, fall risks, home safety risks and hearing or visual             impairment. 5. Diet and physical activities 6. Evidence for depression or mood disorders  The patients weight, height, BMI and visual acuity have been recorded in the chart I have made referrals, counseling and provided education to the patient based review of the above and I have provided the pt with a written personalized care plan for preventive services.  I have provided you with a copy of your personalized plan for preventive services. Please take the time to review along with your updated medication list.  No cancer screening due to age Update pneumovax and flu vaccines today Tries to stay active

## 2017-02-25 NOTE — Progress Notes (Signed)
Subjective:    Patient ID: Tracy Matthews, female    DOB: [birth year], 83 y.o.   MRN: [id number]  HPI Here for Medicare wellness visit and follow up of chronic health conditions Reviewed form and advanced directives Reviewed other doctors No tobacco or alcohol Tries to do some exercise Legally blind No major issues with her hearing No falls Some ongoing mood issues---worse of late No problems with memory  Kyphoplasty done in May Not much help Walks with the rollator Only hurts with standing-- has cut back on tramadol but still uses at times. Regular with the tylenol  Still can't drive Aide once a week---does the shopping and helps with true shower Bed bath otherwise Independent with instrumental ADLs---mostly has prepared foods Uses transportation for visits (like today)  Still on thyroid med Seems to be fine with that  No leg pain with walking--but will have thigh pain in bed at times Usually better if she gets up No foot problems  Has noticed more trouble with her nerves "I just can't stand it"---gets anxious, affects her sleep, etc Not really depressed---but feels "alone" (friends and family are dead) Doesn't think she would want AL---- and concerned about the cost She is a loner--doesn't want to reach out or become involved  Bad indigestion Reluctant to take the omeprazole--but it does help No dysphagia  Current Outpatient Prescriptions on File Prior to Visit  Medication Sig Dispense Refill  . acetaminophen (TYLENOL) 500 MG tablet Take 500 mg by mouth every 6 (six) hours as needed.    Marland Kitchen aspirin 81 MG tablet Take 81 mg by mouth daily.      Marland Kitchen levothyroxine (SYNTHROID, LEVOTHROID) 125 MCG tablet TAKE 1 TABLET BY MOUTH ONCE DAILY BEFOREBREAKFAST 90 tablet 0  . naproxen sodium (ANAPROX) 220 MG tablet Take 220 mg by mouth 2 (two) times daily as needed.    . nitroGLYCERIN (NITROSTAT) 0.4 MG SL tablet Place 1 tablet (0.4 mg total) under the tongue every 5 (five)  minutes as needed for chest pain. 25 tablet 1  . omeprazole (PRILOSEC) 20 MG capsule Take 1 capsule (20 mg total) by mouth daily. 30 capsule 3  . polyethylene glycol powder (GLYCOLAX/MIRALAX) powder TAKE 17GM (LINE INSIDE CAP) IN EIGHT OUNCES OF WATER OR OTHER LIQUID DAILY 527 g 5  . traMADol (ULTRAM) 50 MG tablet TAKE 1 TABLET BY MOUTH 3 TIMES DAILY 90 tablet 0   No current facility-administered medications on file prior to visit.     No Known Allergies  Past Medical History:  Diagnosis Date  . Aortic atherosclerosis ([place])   . Bladder cancer [facility])    Dr Bernardo Heater  . Diverticulosis of colon 1996   on sig  . Hyperlipidemia   . Legal blindness   . Macular degeneration    legally blind  . Osteoporosis   . Pain    chronic pain left chest x years. has subclavian block left arm. has never used ntg  . PVD (peripheral vascular disease) ([place])   . 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 Unspecified hypothyroidism     Past Surgical History:  Procedure Laterality Date  . BLADDER SURGERY  [phone]   bladder cancer removed- Ter[facility] Hart, then [facility]  . CATARACT EXTRACTION    . KYPHOPLASTY N/A 09/18/2016   Procedure: [id number];  Surgeon: Hessie Knows, MD;  Location: [facility] ORS;  Service: Orthopedics;  Laterality: N/A;  . TONSILLECTOMY      Family History  Problem Relation  Age of Onset  . Other Mother        enlarged heart  . Diabetes Unknown        strong in family  . Heart attack Brother 6  . Diabetes Brother   . Cancer Brother        bladder ca  . Hypertension Neg Hx   . Breast cancer Neg Hx   . Colon cancer Neg Hx     Social History   Social History  . Marital status: Widowed    Spouse name: N/A  . Number of children: 0  . Years of education: N/A   Occupational History  . Not on file.   Social History Main Topics  . Smoking status: Former Smoker    Quit date: 09/08/1948  . Smokeless tobacco: Never Used  . Alcohol use No  .  Drug use: No  . Sexual activity: Not on file   Other Topics Concern  . Not on file   Social History Narrative   Widowed 11/08   No children   Doesn't drive   Has living will. Has DNR form already- wants this continued.    Request husbands niece [facility] or [place] as health care POA   Wouldn't want feeding tube    Review of Systems Frequent urination--both day and night No dysuria or hematuria Appetite is fine Weight is stable Bowels are slow---miralax helps some No apparent skin problems Trouble with molars---not eating normally due to this Wears seat belt Not sleeping well---nerves    Objective:   Physical Exam  Constitutional: She is oriented to person, place, and time. She appears well-nourished. No distress.  HENT:  Mouth/Throat: Oropharynx is clear and moist. No oropharyngeal exudate.  Neck: No thyromegaly present.  Cardiovascular: Normal rate, regular rhythm, normal heart sounds and intact distal pulses.  Exam reveals no gallop.   No murmur heard. Pulmonary/Chest: Effort normal and breath sounds normal. No respiratory distress. She has no wheezes. She has no rales.  Abdominal: Soft. She exhibits no distension. There is no tenderness. There is no rebound and no guarding.  Musculoskeletal: She exhibits no edema or tenderness.  Lymphadenopathy:    She has no cervical adenopathy.  Neurological: She is alert and oriented to person, place, and time.  President-- "Daisy Floro, Obama, ?" Not good at math [id number]    D-o-l-r  (didn't want to try) Recall 3/3  Skin: No rash noted. No erythema.  Psychiatric:  Slightly flat affect but not truly depressed          Assessment & Plan:

## 2017-02-25 NOTE — Assessment & Plan Note (Signed)
Anxiety and dysthymia She is a loner and resistant to considering more social interaction (which I think might help). Will try lorazepam for bedtime prn only

## 2017-02-25 NOTE — Assessment & Plan Note (Signed)
Seems euthyroid ?Will check labs ?

## 2017-02-25 NOTE — Addendum Note (Signed)
Addended by: Pilar Grammes on: 02/25/2017 04:48 PM   Modules accepted: Orders

## 2017-02-25 NOTE — Assessment & Plan Note (Signed)
Functional limitations but still lives alone and does okay

## 2017-03-30 ENCOUNTER — Encounter: Payer: Self-pay | Admitting: Podiatry

## 2017-03-30 ENCOUNTER — Other Ambulatory Visit: Payer: Self-pay | Admitting: Internal Medicine

## 2017-03-30 ENCOUNTER — Ambulatory Visit (INDEPENDENT_AMBULATORY_CARE_PROVIDER_SITE_OTHER): Payer: Medicare Other | Admitting: Podiatry

## 2017-03-30 DIAGNOSIS — M79676 Pain in unspecified toe(s): Secondary | ICD-10-CM | POA: Diagnosis not present

## 2017-03-30 DIAGNOSIS — B351 Tinea unguium: Secondary | ICD-10-CM | POA: Diagnosis not present

## 2017-03-30 NOTE — Telephone Encounter (Signed)
Tramadol last filled 10-23-16 #90 Lorazepam last filled 02-25-17 #30  Last OV 02-25-17 Next OV 03-03-18

## 2017-03-30 NOTE — Progress Notes (Signed)
Complaint:  Visit Type: Patient returns to my office for continued preventative foot care services. Complaint: Patient states" my nails have grown long and thick and become painful to walk and wear shoes" . The patient presents for preventative foot care services. No changes to ROS  Podiatric Exam: Vascular: dorsalis pedis and posterior tibial pulses are palpable bilateral. Capillary return is immediate. Temperature gradient is WNL. Skin turgor WNL  Sensorium: Normal Semmes Weinstein monofilament test. Normal tactile sensation bilaterally. Nail Exam: Pt has thick disfigured discolored nails with subungual debris noted bilateral entire nail hallux through fifth toenails Ulcer Exam: There is no evidence of ulcer or pre-ulcerative changes or infection. Orthopedic Exam: Muscle tone and strength are WNL. No limitations in general ROM. No crepitus or effusions noted. Foot type and digits show no abnormalities. Bony prominences are unremarkable. Skin: No Porokeratosis. No infection or ulcers  Diagnosis:  Onychomycosis, , Pain in right toe, pain in left toes  Treatment & Plan Procedures and Treatment: Consent by patient was obtained for treatment procedures. The patient understood the discussion of treatment and procedures well. All questions were answered thoroughly reviewed. Debridement of mycotic and hypertrophic toenails, 1 through 5 bilateral and clearing of subungual debris. No ulceration, no infection noted.  Return Visit-Office Procedure: Patient instructed to return to the office for a follow up visit 3 months   for continued evaluation and treatment.    Tobie Hellen DPM 

## 2017-03-31 NOTE — Telephone Encounter (Signed)
Verbal refill given to [employer]

## 2017-03-31 NOTE — Telephone Encounter (Signed)
Approved: okay for same quantities of both with no refills

## 2017-04-07 ENCOUNTER — Encounter: Payer: Self-pay | Admitting: Emergency Medicine

## 2017-04-07 ENCOUNTER — Emergency Department: Payer: Medicare Other

## 2017-04-07 ENCOUNTER — Observation Stay
Admission: EM | Admit: 2017-04-07 | Discharge: 2017-04-09 | Disposition: A | Discharge status: Home / Self Care | Payer: Medicare Other | Attending: Internal Medicine | Admitting: Internal Medicine

## 2017-04-07 ENCOUNTER — Other Ambulatory Visit: Payer: Self-pay

## 2017-04-07 DIAGNOSIS — I739 Peripheral vascular disease, unspecified: Secondary | ICD-10-CM | POA: Insufficient documentation

## 2017-04-07 DIAGNOSIS — I700 Atherosclerosis of aorta: Secondary | ICD-10-CM | POA: Diagnosis not present

## 2017-04-07 DIAGNOSIS — H548 Legal blindness, as defined in USA: Secondary | ICD-10-CM | POA: Diagnosis not present

## 2017-04-07 DIAGNOSIS — M810 Age-related osteoporosis without current pathological fracture: Secondary | ICD-10-CM | POA: Diagnosis not present

## 2017-04-07 DIAGNOSIS — G8929 Other chronic pain: Secondary | ICD-10-CM | POA: Insufficient documentation

## 2017-04-07 DIAGNOSIS — J8410 Pulmonary fibrosis, unspecified: Secondary | ICD-10-CM | POA: Insufficient documentation

## 2017-04-07 DIAGNOSIS — M170 Bilateral primary osteoarthritis of knee: Secondary | ICD-10-CM | POA: Insufficient documentation

## 2017-04-07 DIAGNOSIS — N179 Acute kidney failure, unspecified: Secondary | ICD-10-CM | POA: Diagnosis not present

## 2017-04-07 DIAGNOSIS — K5730 Diverticulosis of large intestine without perforation or abscess without bleeding: Secondary | ICD-10-CM | POA: Diagnosis not present

## 2017-04-07 DIAGNOSIS — E039 Hypothyroidism, unspecified: Secondary | ICD-10-CM | POA: Insufficient documentation

## 2017-04-07 DIAGNOSIS — I2510 Atherosclerotic heart disease of native coronary artery without angina pectoris: Secondary | ICD-10-CM | POA: Insufficient documentation

## 2017-04-07 DIAGNOSIS — R079 Chest pain, unspecified: Secondary | ICD-10-CM | POA: Diagnosis not present

## 2017-04-07 DIAGNOSIS — K449 Diaphragmatic hernia without obstruction or gangrene: Secondary | ICD-10-CM | POA: Insufficient documentation

## 2017-04-07 DIAGNOSIS — Z7982 Long term (current) use of aspirin: Secondary | ICD-10-CM | POA: Insufficient documentation

## 2017-04-07 DIAGNOSIS — J9621 Acute and chronic respiratory failure with hypoxia: Secondary | ICD-10-CM | POA: Diagnosis not present

## 2017-04-07 DIAGNOSIS — N183 Chronic kidney disease, stage 3 (moderate): Secondary | ICD-10-CM | POA: Insufficient documentation

## 2017-04-07 DIAGNOSIS — J209 Acute bronchitis, unspecified: Secondary | ICD-10-CM | POA: Diagnosis not present

## 2017-04-07 DIAGNOSIS — J470 Bronchiectasis with acute lower respiratory infection: Secondary | ICD-10-CM | POA: Diagnosis not present

## 2017-04-07 DIAGNOSIS — Z87891 Personal history of nicotine dependence: Secondary | ICD-10-CM | POA: Insufficient documentation

## 2017-04-07 DIAGNOSIS — E785 Hyperlipidemia, unspecified: Secondary | ICD-10-CM | POA: Diagnosis not present

## 2017-04-07 DIAGNOSIS — H3530 Unspecified macular degeneration: Secondary | ICD-10-CM | POA: Diagnosis not present

## 2017-04-07 DIAGNOSIS — K219 Gastro-esophageal reflux disease without esophagitis: Secondary | ICD-10-CM | POA: Insufficient documentation

## 2017-04-07 LAB — HEPATIC FUNCTION PANEL
ALBUMIN: 3.2 g/dL — AB (ref 3.5–5.0)
ALK PHOS: 92 U/L (ref 38–126)
ALT: 12 U/L — ABNORMAL LOW (ref 14–54)
AST: 20 U/L (ref 15–41)
BILIRUBIN INDIRECT: 0.9 mg/dL (ref 0.3–0.9)
BILIRUBIN TOTAL: 1 mg/dL (ref 0.3–1.2)
Bilirubin, Direct: 0.1 mg/dL (ref 0.1–0.5)
TOTAL PROTEIN: 6.9 g/dL (ref 6.5–8.1)

## 2017-04-07 LAB — CBC
HCT: 38.5 % (ref 35.0–47.0)
Hemoglobin: 12.9 g/dL (ref 12.0–16.0)
MCH: 30 pg (ref 26.0–34.0)
MCHC: 33.5 g/dL (ref 32.0–36.0)
MCV: 89.8 fL (ref 80.0–100.0)
PLATELETS: 196 10*3/uL (ref 150–440)
RBC: 4.29 MIL/uL (ref 3.80–5.20)
RDW: 14.4 % (ref 11.5–14.5)
WBC: 7.8 10*3/uL (ref 3.6–11.0)

## 2017-04-07 LAB — BASIC METABOLIC PANEL
Anion gap: 11 (ref 5–15)
BUN: 26 mg/dL — AB (ref 6–20)
CALCIUM: 8.7 mg/dL — AB (ref 8.9–10.3)
CHLORIDE: 104 mmol/L (ref 101–111)
CO2: 20 mmol/L — ABNORMAL LOW (ref 22–32)
CREATININE: 1.02 mg/dL — AB (ref 0.44–1.00)
GFR calc non Af Amer: 46 mL/min — ABNORMAL LOW (ref >60)
GFR, EST AFRICAN AMERICAN: 53 mL/min — AB (ref >60)
Glucose, Bld: 113 mg/dL — ABNORMAL HIGH (ref 65–99)
Potassium: 4.5 mmol/L (ref 3.5–5.1)
SODIUM: 135 mmol/L (ref 135–145)

## 2017-04-07 LAB — LIPASE, BLOOD: LIPASE: 17 U/L (ref 11–51)

## 2017-04-07 LAB — TROPONIN I
TROPONIN I: 0.03 ng/mL — AB (ref <0.03)
Troponin I: <0.03 ng/mL (ref <0.03)

## 2017-04-07 LAB — MRSA PCR SCREENING: MRSA by PCR: NEGATIVE

## 2017-04-07 MED ORDER — ACETAMINOPHEN 325 MG PO TABS: 650.0000 mg | ORAL_TABLET | Freq: Four times a day (QID) | ORAL | Status: DC | PRN

## 2017-04-07 MED ORDER — ONDANSETRON HCL 4 MG/2ML IJ SOLN
4.0000 mg | Freq: Once | INTRAMUSCULAR | Status: AC
  Administered 2017-04-07: 4 mg via INTRAVENOUS
  Filled 2017-04-07: qty 2

## 2017-04-07 MED ORDER — NITROGLYCERIN 2 % TD OINT
0.5000 [in_us] | TOPICAL_OINTMENT | TRANSDERMAL | Status: AC
  Administered 2017-04-07: 0.5 [in_us] via TOPICAL
  Filled 2017-04-07: qty 1

## 2017-04-07 MED ORDER — MORPHINE SULFATE (PF) 2 MG/ML IV SOLN
2.0000 mg | Freq: Once | INTRAVENOUS | Status: AC
Start: 2017-04-07 — End: 2017-04-07
  Administered 2017-04-07: 2 mg via INTRAVENOUS
  Filled 2017-04-07: qty 1

## 2017-04-07 MED ORDER — LEVOTHYROXINE SODIUM 25 MCG PO TABS
125.0000 ug | ORAL_TABLET | Freq: Every day | ORAL | Status: DC
  Administered 2017-04-08 – 2017-04-09 (×2): 125 ug via ORAL
  Filled 2017-04-07 (×2): qty 1

## 2017-04-07 MED ORDER — GI COCKTAIL ~~LOC~~
30.0000 mL | Freq: Once | ORAL | Status: DC
  Filled 2017-04-07: qty 30

## 2017-04-07 MED ORDER — ONDANSETRON HCL 4 MG/2ML IJ SOLN: 4.0000 mg | Freq: Four times a day (QID) | INTRAMUSCULAR | Status: DC | PRN

## 2017-04-07 MED ORDER — HEPARIN SODIUM (PORCINE) 5000 UNIT/ML IJ SOLN
5000.0000 [IU] | Freq: Three times a day (TID) | INTRAMUSCULAR | Status: DC
  Administered 2017-04-07 – 2017-04-08 (×3): 5000 [IU] via SUBCUTANEOUS
  Filled 2017-04-07 (×3): qty 1

## 2017-04-07 MED ORDER — MORPHINE SULFATE (PF) 2 MG/ML IV SOLN: 1.0000 mg | INTRAVENOUS | Status: DC | PRN

## 2017-04-07 MED ORDER — LORAZEPAM 0.5 MG PO TABS
0.5000 mg | ORAL_TABLET | Freq: Every day | ORAL | Status: DC
  Administered 2017-04-07 – 2017-04-08 (×2): 0.5 mg via ORAL
  Filled 2017-04-07 (×2): qty 1

## 2017-04-07 MED ORDER — ACETAMINOPHEN 650 MG RE SUPP
650.0000 mg | Freq: Four times a day (QID) | RECTAL | Status: DC | PRN
Start: 2017-04-07 — End: 2017-04-09

## 2017-04-07 MED ORDER — DOCUSATE SODIUM 100 MG PO CAPS
100.0000 mg | ORAL_CAPSULE | Freq: Two times a day (BID) | ORAL | Status: DC
  Administered 2017-04-07 – 2017-04-09 (×4): 100 mg via ORAL
  Filled 2017-04-07 (×4): qty 1

## 2017-04-07 MED ORDER — ASPIRIN EC 81 MG PO TBEC
81.0000 mg | DELAYED_RELEASE_TABLET | Freq: Every day | ORAL | Status: DC
  Administered 2017-04-07 – 2017-04-09 (×3): 81 mg via ORAL
  Filled 2017-04-07 (×3): qty 1

## 2017-04-07 MED ORDER — NITROGLYCERIN 0.4 MG SL SUBL: 0.4000 mg | SUBLINGUAL_TABLET | SUBLINGUAL | Status: DC | PRN

## 2017-04-07 MED ORDER — ALUM & MAG HYDROXIDE-SIMETH 200-200-20 MG/5ML PO SUSP: 15.0000 mL | ORAL | Status: DC | PRN

## 2017-04-07 MED ORDER — ONDANSETRON HCL 4 MG PO TABS: 4.0000 mg | ORAL_TABLET | Freq: Four times a day (QID) | ORAL | Status: DC | PRN

## 2017-04-07 NOTE — Progress Notes (Addendum)
Patient arrived to 2A Room 259. Patient denies pain and all questions answered. Patient oriented to unit and use of call bell. Skin assessment completed with Vincente Liberty RN and skin intact. A&Ox4, VSS, and NSR on verified tele-box #[id number]. Nursing staff will continue to monitor for any changes in patient status. Tracy Reaper, RN

## 2017-04-07 NOTE — ED Notes (Signed)
Lorrie RN, aware of bed assigned  

## 2017-04-07 NOTE — ED Provider Notes (Signed)
[facility] Emergency Department Provider Note  Time seen: 3:59 PM  I have reviewed the triage vital signs and the nursing notes.   HISTORY  Chief Complaint Chest Pain    HPI Tracy Matthews is a 83 y.o. female with a past medical history of hyperlipidemia, chronic pain, CAD, presents to the emergency department for chest pain.  According to EMS and the patient she developed chest pain over the past several days, stated was a 10/10 at her nursing facility [facility].  EMS was called.  EMS gave the patient 3 additional aspirin as well as 1 spray of nitroglycerin.  Patient states her chest pain is decreased to an 8/10.  Patient states that has been "a while" since last time she had chest pain but cannot quantify how long.  Patient does state some mild shortness of breath with the chest pain but denies any nausea or diaphoresis.  Denies any leg pain or swelling.   Past Medical History:  Diagnosis Date  . Aortic atherosclerosis ([place])   . Bladder cancer [facility])    Dr Bernardo Heater  . Diverticulosis of colon 1996   on sig  . Hyperlipidemia   . Legal blindness   . Macular degeneration    legally blind  . Osteoporosis   . Pain    chronic pain left chest x years. has subclavian block left arm. has never used ntg  . PVD (peripheral vascular disease) ([place])   . 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 Unspecified hypothyroidism     Patient Active Problem List   Diagnosis Date Noted  . Mood disorder ([place]) 02/25/2017  . Preventative health care 02/05/2016  . Advance directive discussed with patient 02/05/2016  . Aortic atherosclerosis ([place])   . Constipation 08/28/2014  . Back pain 08/21/2014  . Osteoarthritis of both knees 01/03/2014  . Legal blindness   . Hypothyroidism 06/29/2007  . DIVERTICULOSIS, COLON 06/29/2007  . OSTEOPOROSIS 06/29/2007    Past Surgical History:  Procedure Laterality Date  . BLADDER SURGERY  [phone]    bladder cancer removed- [facility] Hart, then [facility]  . CATARACT EXTRACTION    . KYPHOPLASTY N/A 09/18/2016   Procedure: [id number];  Surgeon: Hessie Knows, MD;  Location: [facility] ORS;  Service: Orthopedics;  Laterality: N/A;  . TONSILLECTOMY      Prior to Admission medications   Medication Sig Start Date End Date Taking? Authorizing Provider  acetaminophen (TYLENOL) 500 MG tablet Take 500 mg by mouth every 6 (six) hours as needed.    [provider]  aspirin 81 MG tablet Take 81 mg by mouth daily.      [provider]  levothyroxine (SYNTHROID, LEVOTHROID) 125 MCG tablet TAKE 1 TABLET BY MOUTH ONCE DAILY BEFOREBREAKFAST 02/23/17   Viviana Simpler I, MD  LORazepam (ATIVAN) 0.5 MG tablet TAKE 1/2-1 TABLET BY MOUTH AT BEDTIME ASNEEDED FOR ANXIETY 03/31/17   Venia Carbon, MD  naproxen sodium (ANAPROX) 220 MG tablet Take 220 mg by mouth 2 (two) times daily as needed.    [provider]  nitroGLYCERIN (NITROSTAT) 0.4 MG SL tablet Place 1 tablet (0.4 mg total) under the tongue every 5 (five) minutes as needed for chest pain. 01/09/15   Venia Carbon, MD  omeprazole (PRILOSEC) 20 MG capsule Take 1 capsule (20 mg total) by mouth daily. 09/17/16   Venia Carbon, MD  polyethylene glycol powder (GLYCOLAX/MIRALAX) powder TAKE 17GM (LINE INSIDE CAP) IN EIGHT OUNCES OF  WATER OR OTHER LIQUID DAILY 11/27/16   Venia Carbon, MD  traMADol (ULTRAM) 50 MG tablet TAKE 1 TABLET BY MOUTH 3 TIMES DAILY AS NEEDED 03/31/17   Venia Carbon, MD    No Known Allergies  Family History  Problem Relation Age of Onset  . Other Mother        enlarged heart  . Diabetes Unknown        strong in family  . Heart attack Brother 20  . Diabetes Brother   . Cancer Brother        bladder ca  . Hypertension Neg Hx   . Breast cancer Neg Hx   . Colon cancer Neg Hx     Social History Social History   Tobacco Use  . Smoking status: Former Smoker    Last attempt to quit: 09/08/1948     Years since quitting: 68.6  . Smokeless tobacco: Never Used  Substance Use Topics  . Alcohol use: No    Alcohol/week: 0.0 oz  . Drug use: No    Review of Systems Constitutional: Negative for fever. Cardiovascular: Positive for chest pain Respiratory: Mild shortness of breath Gastrointestinal: Negative for abdominal pain, vomiting and diarrhea. Musculoskeletal: Negative for leg pain or swelling Neurological: Negative for headache All other ROS negative  ____________________________________________   PHYSICAL EXAM:  VITAL SIGNS: ED Triage Vitals  Enc Vitals Group     BP 04/07/17 1407 (!) 163/62     Pulse Rate 04/07/17 1407 81     Resp 04/07/17 1407 (!) 22     Temp 04/07/17 1407 (!) 96.5 F (35.8 C)     Temp Source 04/07/17 1407 Axillary     SpO2 04/07/17 1405 95 %     Weight 04/07/17 1408 155 lb (70.3 kg)     Height 04/07/17 1408 5\' 3"  (1.6 m)     Head Circumference --      Peak Flow --      Pain Score 04/07/17 1405 8     Pain Loc --      Pain Edu? --      Excl. in [place]? --     Constitutional: Alert and oriented. Well appearing and in no distress. Eyes: Normal exam ENT   Head: Normocephalic and atraumatic.   Mouth/Throat: Mucous membranes are moist. Cardiovascular: Normal rate, regular rhythm. No murmur Respiratory: Normal respiratory effort without tachypnea nor retractions. Breath sounds are clear Gastrointestinal: Soft and nontender. No distention Musculoskeletal: Nontender with normal range of motion in all extremities. No lower extremity tenderness or edema. Neurologic:  Normal speech and language. No gross focal neurologic deficits Skin:  Skin is warm, dry and intact.  Psychiatric: Mood and affect are normal.   ____________________________________________    EKG  EKG reviewed and interpreted by myself shows normal sinus rhythm 83 bpm, narrow QRS, normal axis, normal intervals, no concerning ST  changes.  ____________________________________________    RADIOLOGY  Chest x-ray shows possible chronic interstitial lung disease or scarring possible edema.  ____________________________________________   INITIAL IMPRESSION / ASSESSMENT AND PLAN / ED COURSE  Pertinent labs & imaging results that were available during my care of the patient were reviewed by me and considered in my medical decision making (see chart for details).  Patient presents to the emergency department for several days of chest pain worse today.  Patient states mild shortness of breath but denies nausea or diaphoresis.  Differential would include ACS, pneumonia, pneumothorax, chest wall pain, costochondritis. Overall the patient appears  very well in the emergency department, calm and comfortable, no distress.  Does not appear to be in active discomfort.  Given the patient's complaint of chest pain will check labs, EKG and chest x-ray  Patient is EKG is largely normal, no significant abnormalities.  Chest x-ray shows possible interstitial lung disease versus mild pulmonary edema.  Patient satting 92-93% on room air placed on 2 L of oxygen for comfort.  Currently awaiting lab results.  I reviewed the patient's records from Dr. Silvio Pate.  Patient states her chest pain has diminished some in the emergency department, but remains moderate..  Her repeat troponin is negative.  Given her age and comorbidities I recommended admission to the hospital.  Patient wishes for both of her nieces to arrive she states should be shortly before making that decision.  Lipase is normal, LFTs are normal.  Patient continues to have moderate chest pain we will dose 2 mg of morphine for pain control.  Patient will be admitted to the hospitalist service for further treatment.  ____________________________________________   FINAL CLINICAL IMPRESSION(S) / ED DIAGNOSES  Chest pain    Harvest Dark, MD 04/07/17 1950

## 2017-04-07 NOTE — ED Triage Notes (Signed)
Pt arrives via [facility] from [facility] with complaints of chest pain x several days. Pt reports pain increased today (10/10). Pt had one aspirin at [facility] and three en route with EMS. EMS reports administering 1 nitro, that helped pain decrease from 10 to 8. Hx blockage and bladder cancer. Pt A&O but repeating she wishes she wouldn't have gone to nurse's station to report pain because she "didn't want to do all this" and "would rather die at home."

## 2017-04-07 NOTE — H&P (Signed)
Tracy Matthews is an 83 y.o. female.   Chief Complaint: Pain HPI: The patient with past medical history of macular degeneration, hypothyroidism as well as peripheral vascular disease presents emergency department via EMS from her nursing home after complaining of chest pain.  The patient has had gradually increasingly bad episodes of chest pain.  Patient states the pain is substernal and feels like heartburn but much worse.  The pain is associated with shortness of breath which is also become  worse over the last week.  Due to ongoing chest pain as well as dyspnea emergency department staff called the hospitalist service for admission.  Past Medical History:  Diagnosis Date  . Aortic atherosclerosis ([place])   . Bladder cancer [facility])    Dr Bernardo Heater  . Diverticulosis of colon 1996   on sig  . Hyperlipidemia   . Legal blindness   . Macular degeneration    legally blind  . Osteoporosis   . Pain    chronic pain left chest x years. has subclavian block left arm. has never used ntg  . PVD (peripheral vascular disease) ([place])   . 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 Unspecified hypothyroidism     Past Surgical History:  Procedure Laterality Date  . BLADDER SURGERY  [phone]   bladder cancer removed- [facility], then [facility]  . CATARACT EXTRACTION    . KYPHOPLASTY N/A 09/18/2016   Procedure: [id number];  Surgeon: Hessie Knows, MD;  Location: [facility] ORS;  Service: Orthopedics;  Laterality: N/A;  . TONSILLECTOMY      Family History  Problem Relation Age of Onset  . Other Mother        enlarged heart  . Diabetes Unknown        strong in family  . Heart attack Brother 5  . Diabetes Brother   . Cancer Brother        bladder ca  . Hypertension Neg Hx   . Breast cancer Neg Hx   . Colon cancer Neg Hx    Social History:  reports that she quit smoking about 68 years ago. she has never used smokeless tobacco. She reports that she does not drink alcohol  or use drugs.  Allergies: No Known Allergies  Prior to Admission medications   Medication Sig Start Date End Date Taking? Authorizing Provider  aspirin 81 MG tablet Take 81 mg by mouth daily.     Yes [provider]  levothyroxine (SYNTHROID, LEVOTHROID) 125 MCG tablet TAKE 1 TABLET BY MOUTH ONCE DAILY BEFOREBREAKFAST 02/23/17  Yes Venia Carbon, MD  Probiotic Product (PROBIOTIC PO) Take 1 tablet by mouth daily.   Yes [provider]  traMADol (ULTRAM) 50 MG tablet TAKE 1 TABLET BY MOUTH 3 TIMES DAILY AS NEEDED 03/31/17  Yes Venia Carbon, MD  acetaminophen (TYLENOL) 500 MG tablet Take 500 mg by mouth every 6 (six) hours as needed.    [provider]  LORazepam (ATIVAN) 0.5 MG tablet TAKE 1/2-1 TABLET BY MOUTH AT BEDTIME ASNEEDED FOR ANXIETY Patient not taking: Reported on 04/07/2017 03/31/17   Venia Carbon, MD  nitroGLYCERIN (NITROSTAT) 0.4 MG SL tablet Place 1 tablet (0.4 mg total) under the tongue every 5 (five) minutes as needed for chest pain. 01/09/15   Venia Carbon, MD  omeprazole (PRILOSEC) 20 MG capsule Take 1 capsule (20 mg total) by mouth daily. Patient not taking: Reported on 04/07/2017 09/17/16   Venia Carbon, MD  polyethylene  glycol powder (GLYCOLAX/MIRALAX) powder TAKE 17GM (LINE INSIDE CAP) IN EIGHT OUNCES OF WATER OR OTHER LIQUID DAILY Patient not taking: Reported on 04/07/2017 11/27/16   Venia Carbon, MD     Results for orders placed or performed during the hospital encounter of 04/07/17 (from the past 48 hour(s))  Basic metabolic panel     Status: Abnormal   Collection Time: 04/07/17  2:41 PM  Result Value Ref Range   Sodium 135 135 - 145 mmol/L   Potassium 4.5 3.5 - 5.1 mmol/L   Chloride 104 101 - 111 mmol/L   CO2 20 (L) 22 - 32 mmol/L   Glucose, Bld 113 (H) 65 - 99 mg/dL   BUN 26 (H) 6 - 20 mg/dL   Creatinine, Ser 1.02 (H) 0.44 - 1.00 mg/dL   Calcium 8.7 (L) 8.9 - 10.3 mg/dL   GFR calc non Af Amer 46 (L) >60  mL/min   GFR calc Af Amer 53 (L) >60 mL/min    Comment: (NOTE) The eGFR has been calculated using the CKD EPI equation. This calculation has not been validated in all clinical situations. eGFR's persistently <60 mL/min signify possible Chronic Kidney Disease.    Anion gap 11 5 - 15  CBC     Status: None   Collection Time: 04/07/17  2:41 PM  Result Value Ref Range   WBC 7.8 3.6 - 11.0 K/uL   RBC 4.29 3.80 - 5.20 MIL/uL   Hemoglobin 12.9 12.0 - 16.0 g/dL   HCT 38.5 35.0 - 47.0 %   MCV 89.8 80.0 - 100.0 fL   MCH 30.0 26.0 - 34.0 pg   MCHC 33.5 32.0 - 36.0 g/dL   RDW 14.4 11.5 - 14.5 %   Platelets 196 150 - 440 K/uL  Troponin I     Status: Abnormal   Collection Time: 04/07/17  2:41 PM  Result Value Ref Range   Troponin I 0.03 (HH) <0.03 ng/mL    Comment: CRITICAL RESULT CALLED TO, READ BACK BY AND VERIFIED WITH PAULETTE WYATTE @ 4010 ON 04/07/2017 BY CAF   Troponin I     Status: None   Collection Time: 04/07/17  5:37 PM  Result Value Ref Range   Troponin I <0.03 <0.03 ng/mL  Hepatic function panel     Status: Abnormal   Collection Time: 04/07/17  5:37 PM  Result Value Ref Range   Total Protein 6.9 6.5 - 8.1 g/dL   Albumin 3.2 (L) 3.5 - 5.0 g/dL   AST 20 15 - 41 U/L   ALT 12 (L) 14 - 54 U/L   Alkaline Phosphatase 92 38 - 126 U/L   Total Bilirubin 1.0 0.3 - 1.2 mg/dL   Bilirubin, Direct 0.1 0.1 - 0.5 mg/dL   Indirect Bilirubin 0.9 0.3 - 0.9 mg/dL  Lipase, blood     Status: None   Collection Time: 04/07/17  5:37 PM  Result Value Ref Range   Lipase 17 11 - 51 U/L   Dg Chest Port 1 View  Result Date: 04/07/2017 CLINICAL DATA:  Chest pain. EXAM: PORTABLE CHEST 1 VIEW COMPARISON:  None. FINDINGS: Mild cardiomegaly is noted. Atherosclerosis of thoracic aorta is noted. Diffuse reticulonodular densities are noted throughout both lungs which may represent chronic interstitial lung disease or scarring, but superimposed acute edema or inflammation cannot be excluded. No pneumothorax  or pleural effusion is noted. Bony thorax is unremarkable. IMPRESSION: Aortic atherosclerosis. Diffuse reticulonodular densities are noted throughout both lungs which may represent chronic interstitial lung  disease or scarring, but superimposed acute edema or inflammation cannot be excluded. Electronically Signed   By: Marijo Conception, M.D.   On: 04/07/2017 15:28    Review of Systems  Constitutional: Negative for chills and fever.  HENT: Negative for sore throat and tinnitus.   Eyes: Negative for blurred vision and redness.  Respiratory: Positive for shortness of breath. Negative for cough.   Cardiovascular: Positive for chest pain. Negative for palpitations, orthopnea and PND.  Gastrointestinal: Negative for abdominal pain, diarrhea, nausea and vomiting.  Genitourinary: Negative for dysuria, frequency and urgency.  Musculoskeletal: Negative for joint pain and myalgias.  Skin: Negative for rash.       No lesions  Neurological: Negative for speech change, focal weakness and weakness.  Endo/Heme/Allergies: Does not bruise/bleed easily.       No temperature intolerance  Psychiatric/Behavioral: Negative for depression and suicidal ideas.    Blood pressure (!) 170/75, pulse 82, temperature (!) 96.5 F (35.8 C), temperature source Axillary, resp. rate (!) 21, height 5' 3" (1.6 m), weight 70.3 kg (155 lb), SpO2 93 %. Physical Exam  Vitals reviewed. Constitutional: She is oriented to person, place, and time. She appears well-developed and well-nourished. No distress.  HENT:  Head: Normocephalic and atraumatic.  Mouth/Throat: Oropharynx is clear and moist.  Eyes: Conjunctivae and EOM are normal. Pupils are equal, round, and reactive to light. No scleral icterus.  Neck: Normal range of motion. No tracheal deviation present. No thyromegaly present.  Cardiovascular: Normal rate, regular rhythm and normal heart sounds. Exam reveals no gallop and no friction rub.  No murmur heard. Respiratory:  Effort normal and breath sounds normal.  GI: Soft. Bowel sounds are normal. She exhibits no distension. There is no tenderness.  Genitourinary:  Genitourinary Comments: Deferred  Musculoskeletal: Normal range of motion. She exhibits no edema.  Lymphadenopathy:    She has no cervical adenopathy.  Neurological: She is alert and oriented to person, place, and time. No cranial nerve deficit. She exhibits normal muscle tone.  Skin: Skin is warm and dry. No rash noted. No erythema.  Psychiatric: She has a normal mood and affect. Her behavior is normal. Judgment and thought content normal.     Assessment/Plan This is a 83 year old female admitted for chest pain. 1.  Chest pain: No EKG changes to indicate ischemia.  Follow cardiac enzymes.  Monitor telemetry.  Consult cardiology.  Nitroglycerin as needed.  Morphine as needed for severe pain. 2.  Hypothyroidism: Check TSH; continue Synthroid 3.  CKD: Stage III; avoid nephrotoxic agents. 4.  GERD: Continue PPI per home regimen 5.  DVT prophylaxis: Heparin 6.  GI prophylaxis: As above The patient is a full code.  Time spent on admission orders and patient care approximately 45 minutes  Harrie Foreman, MD 04/07/2017, 8:45 PM

## 2017-04-08 ENCOUNTER — Encounter: Payer: Self-pay | Admitting: Internal Medicine

## 2017-04-08 ENCOUNTER — Observation Stay (HOSPITAL_BASED_OUTPATIENT_CLINIC_OR_DEPARTMENT_OTHER)
Admit: 2017-04-08 | Discharge: 2017-04-08 | Disposition: A | Discharge status: Home / Self Care | Payer: Medicare Other | Attending: Physician Assistant | Admitting: Physician Assistant

## 2017-04-08 ENCOUNTER — Observation Stay: Payer: Medicare Other

## 2017-04-08 DIAGNOSIS — K449 Diaphragmatic hernia without obstruction or gangrene: Secondary | ICD-10-CM

## 2017-04-08 DIAGNOSIS — K219 Gastro-esophageal reflux disease without esophagitis: Secondary | ICD-10-CM | POA: Diagnosis not present

## 2017-04-08 DIAGNOSIS — R0602 Shortness of breath: Secondary | ICD-10-CM

## 2017-04-08 DIAGNOSIS — R072 Precordial pain: Secondary | ICD-10-CM | POA: Diagnosis not present

## 2017-04-08 DIAGNOSIS — E039 Hypothyroidism, unspecified: Secondary | ICD-10-CM | POA: Diagnosis not present

## 2017-04-08 DIAGNOSIS — R079 Chest pain, unspecified: Secondary | ICD-10-CM

## 2017-04-08 DIAGNOSIS — J8410 Pulmonary fibrosis, unspecified: Secondary | ICD-10-CM | POA: Diagnosis not present

## 2017-04-08 DIAGNOSIS — N183 Chronic kidney disease, stage 3 (moderate): Secondary | ICD-10-CM | POA: Diagnosis not present

## 2017-04-08 LAB — ECHOCARDIOGRAM COMPLETE
Height: 63 in
WEIGHTICAEL: 2430.4 [oz_av]

## 2017-04-08 LAB — FIBRIN DERIVATIVES D-DIMER (ARMC ONLY): FIBRIN DERIVATIVES D-DIMER (ARMC): 2027.91 ng{FEU}/mL — AB (ref 0.00–499.00)

## 2017-04-08 LAB — TROPONIN I
Troponin I: <0.03 ng/mL (ref <0.03)
Troponin I: <0.03 ng/mL (ref <0.03)

## 2017-04-08 LAB — TSH: TSH: 1.697 u[IU]/mL (ref 0.350–4.500)

## 2017-04-08 LAB — BRAIN NATRIURETIC PEPTIDE: B Natriuretic Peptide: 157 pg/mL — ABNORMAL HIGH (ref 0.0–100.0)

## 2017-04-08 MED ORDER — FAMOTIDINE 20 MG PO TABS
20.0000 mg | ORAL_TABLET | Freq: Every day | ORAL | Status: DC
  Administered 2017-04-08 – 2017-04-09 (×2): 20 mg via ORAL
  Filled 2017-04-08 (×2): qty 1

## 2017-04-08 MED ORDER — LEVOFLOXACIN 750 MG PO TABS
750.0000 mg | ORAL_TABLET | Freq: Once | ORAL | Status: AC
  Administered 2017-04-08: 750 mg via ORAL
  Filled 2017-04-08: qty 1

## 2017-04-08 MED ORDER — LEVOFLOXACIN 750 MG PO TABS: 750.0000 mg | ORAL_TABLET | ORAL | Status: DC

## 2017-04-08 MED ORDER — FUROSEMIDE 40 MG PO TABS
40.0000 mg | ORAL_TABLET | Freq: Once | ORAL | Status: AC
  Administered 2017-04-08: 40 mg via ORAL
  Filled 2017-04-08: qty 1

## 2017-04-08 MED ORDER — IOPAMIDOL (ISOVUE-370) INJECTION 76%
60.0000 mL | Freq: Once | INTRAVENOUS | Status: AC | PRN
Start: 2017-04-08 — End: 2017-04-08
  Administered 2017-04-08: 60 mL via INTRAVENOUS

## 2017-04-08 MED ORDER — PREDNISONE 50 MG PO TABS
50.0000 mg | ORAL_TABLET | Freq: Every day | ORAL | Status: DC
  Administered 2017-04-08 – 2017-04-09 (×2): 50 mg via ORAL
  Filled 2017-04-08 (×2): qty 1

## 2017-04-08 MED ORDER — IPRATROPIUM-ALBUTEROL 0.5-2.5 (3) MG/3ML IN SOLN
3.0000 mL | Freq: Four times a day (QID) | RESPIRATORY_TRACT | Status: DC
  Administered 2017-04-08 – 2017-04-09 (×5): 3 mL via RESPIRATORY_TRACT
  Filled 2017-04-08 (×5): qty 3

## 2017-04-08 NOTE — Progress Notes (Signed)
Patient ordered famotidine 20 mg bid. Pt's CrCl 32.1 ml/min  Will dose reduce to famotidine 20 mg daily.  Tobie Lords, PharmD, BCPS Clinical Pharmacist 04/08/2017

## 2017-04-08 NOTE — Progress Notes (Signed)
*  PRELIMINARY RESULTS* Echocardiogram 2D Echocardiogram has been performed.  Sherrie Sport 04/08/2017, 1:46 PM

## 2017-04-08 NOTE — Care Management Obs Status (Signed)
[facility] NOTIFICATION   Patient Details  Name: STELLAH DONOVAN MRN: [id number] Date of Birth: [birth year]   Medicare Observation Status Notification Given:  Yes Notice signed, one given to patient and the other to HIM for scanning  Katrina Stack, RN 04/08/2017, 4:21 PM

## 2017-04-08 NOTE — Care Management (Addendum)
Patient is a resident of the [facility]. Placed in observation for chest pain. There is a documented oxygen sat of 84% at rest on room air from this morning. Spoke with patient and her [place] discharge back to [facility] 04/09/2017.  Notified Zenovia Jordan the nurse over assisted living residents.  Referral preference is [facility].  Referral called and accepted for SN and PT.  patient will be seen 11/23. No agency [preference for the oxygen.  Referral called to [facility] and portable will be delivered today. [facility]king will be contacting [facility] to provide in home care round the clock

## 2017-04-08 NOTE — Progress Notes (Signed)
[facility] [place] NAME: Tracy Matthews    MR#:  [id number]  DATE OF BIRTH:  February 17, 1922  SUBJECTIVE:  CHIEF COMPLAINT:   Chief Complaint  Patient presents with  . Chest Pain   No chest pain today. Sats 84% on RA  REVIEW OF SYSTEMS:    Review of Systems  Constitutional: Positive for malaise/fatigue. Negative for chills and fever.  HENT: Negative for sore throat.   Eyes: Negative for blurred vision, double vision and pain.  Respiratory: Negative for cough, hemoptysis, shortness of breath and wheezing.   Cardiovascular: Negative for chest pain, palpitations, orthopnea and leg swelling.  Gastrointestinal: Negative for abdominal pain, constipation, diarrhea, heartburn, nausea and vomiting.  Genitourinary: Negative for dysuria and hematuria.  Musculoskeletal: Negative for back pain and joint pain.  Skin: Negative for rash.  Neurological: Negative for sensory change, speech change, focal weakness and headaches.  Endo/Heme/Allergies: Does not bruise/bleed easily.  Psychiatric/Behavioral: Negative for depression. The patient is not nervous/anxious.     DRUG ALLERGIES:  No Known Allergies  VITALS:  Blood pressure (!) 103/56, pulse 77, temperature 97.7 F (36.5 C), temperature source Oral, resp. rate 18, height 5\' 3"  (1.6 m), weight 68.9 kg (151 lb 14.4 oz), SpO2 94 %.  PHYSICAL EXAMINATION:   Physical Exam  GENERAL:  83 y.o.-year-old patient lying in the bed with no acute distress.  EYES: Pupils equal, round, reactive to light and accommodation. No scleral icterus. Extraocular muscles intact.  HEENT: Head atraumatic, normocephalic. Oropharynx and nasopharynx clear.  NECK:  Supple, no jugular venous distention. No thyroid enlargement, no tenderness.  LUNGS: Bilateral expiratory and inspiratory crackles CARDIOVASCULAR: S1, S2 normal. No murmurs, rubs, or gallops.  ABDOMEN: Soft, nontender, nondistended. Bowel sounds present. No  organomegaly or mass.  EXTREMITIES: No cyanosis, clubbing or edema b/l.    NEUROLOGIC: Cranial nerves II through XII are intact. No focal Motor or sensory deficits b/l.   PSYCHIATRIC: The patient is alert and awake SKIN: No obvious rash, lesion, or ulcer.   LABORATORY PANEL:   CBC Recent Labs  Lab 04/07/17 1441  WBC 7.8  HGB 12.9  HCT 38.5  PLT 196   ------------------------------------------------------------------------------------------------------------------ Chemistries  Recent Labs  Lab 04/07/17 1441 04/07/17 1737  NA 135  --   K 4.5  --   CL 104  --   CO2 20*  --   GLUCOSE 113*  --   BUN 26*  --   CREATININE 1.02*  --   CALCIUM 8.7*  --   AST  --  20  ALT  --  12*  ALKPHOS  --  92  BILITOT  --  1.0   ------------------------------------------------------------------------------------------------------------------  Cardiac Enzymes Recent Labs  Lab 04/08/17 0602  TROPONINI <0.03   ------------------------------------------------------------------------------------------------------------------  RADIOLOGY:  Dg Chest Port 1 View  Result Date: 04/07/2017 CLINICAL DATA:  Chest pain. EXAM: PORTABLE CHEST 1 VIEW COMPARISON:  None. FINDINGS: Mild cardiomegaly is noted. Atherosclerosis of thoracic aorta is noted. Diffuse reticulonodular densities are noted throughout both lungs which may represent chronic interstitial lung disease or scarring, but superimposed acute edema or inflammation cannot be excluded. No pneumothorax or pleural effusion is noted. Bony thorax is unremarkable. IMPRESSION: Aortic atherosclerosis. Diffuse reticulonodular densities are noted throughout both lungs which may represent chronic interstitial lung disease or scarring, but superimposed acute edema or inflammation cannot be excluded. Electronically Signed   By: Marijo Conception, M.D.   On: 04/07/2017 15:28     ASSESSMENT AND  PLAN:   *Acute hypoxic respiratory failure.  Chest x-ray shows  changes from pulmonary fibrosis.  Crackles on examination.  Will give 1 dose of Lasix.  Elevated d-dimer.  Check CTA chest to rule out PE.  *Chest pain.  Atypical.  Troponin normal.  Echocardiogram pending.  Discussed with Dr. Rockey Situ of cardiology. Patient and family do not want cardiac catheterization or CABG.  *DVT prophylaxis with Lovenox  All the records are reviewed and case discussed with Care Management/Social Worker Management plans discussed with the patient, family and they are in agreement.  CODE STATUS: FULL CODE  TOTAL TIME TAKING CARE OF THIS PATIENT: 35 minutes.   POSSIBLE D/C IN 1-2 DAYS, DEPENDING ON CLINICAL CONDITION.  Neita Carp M.D on 04/08/2017 at 12:24 PM  Between 7am to 6pm - Pager - [phone]  After 6pm go to [email or web address] - password [email or web address] [facility]  Office  [phone]  CC: Primary care physician; Venia Carbon, MD  Note: This dictation was prepared with Dragon dictation along with smaller phrase technology. Any transcriptional errors that result from this process are unintentional.

## 2017-04-08 NOTE — Progress Notes (Signed)
CT shows pulmonary fibrosis and patient is hypoxic. Likely has chronic resp failure. Will need Home oxygen

## 2017-04-08 NOTE — Consult Note (Signed)
Cardiology Consultation:   Patient ID: Tracy Matthews; [id number]; 12/18/1921   Admit date: 04/07/2017 Date of Consult: 04/08/2017  Primary Care Provider: Venia Carbon, MD Primary Cardiologist: New to [facility] - consult by Rockey Situ   Patient Profile:   Tracy Matthews is a 83 y.o. female with a hx of PVD, hypothyroidism, legal blindness, HLD, bladder cancer, aortic atherosclerosis, prior remote tobacco abuse, and diverticulosis who is being seen today for the evaluation of chest pain and SOB at the request of Dr. Marcille Blanco.  History of Present Illness:   [name] has previously seen Dr. Rollene Fare though details of those visits and workup are unclear.   For the past 2-3 weeks she has been noting increased SOB with substernal chest burning that she has described as "heart burn, but worse." Symptoms are not associated with exertion. Symptoms got significantly worse on 11/20 prompting her to present to the RN at [facility] where she was noted to be hypoxic. No associated palpitations, diaphoresis, nausea, vomiting, dizziness, presyncope, or syncope. No cough, PND, early satiety, lower extremity swelling, or orthopnea.   Upon the patient's arrival to [facility] they were found to have BP 163/62-->100/54 currently, HR 81 bpm, temp 96.5, oxygen saturation 92% on room air, weight 152 pounds. EKG as below, CXR showed aortic atherosclerosis with diffuse reticulonodular densities throughout both lungs. Labs showed troponin peak of 0.03, negative x 2, SCr 1.02, BUN 26, K+ 4.5, glucose 113, Na 135, WBC 7.8, HGB 12.9, PLT 196, TSH normal. She was given ASA, morphine, and Ativan in the ED. Nitropaste was also applied. She continues to by hypoxic in the low 90s on room air at this time, requiring nasal cannula at 2 L. She reports improved SOB and chest pain.   Past Medical History:  Diagnosis Date  . Aortic atherosclerosis ([place])   . Bladder cancer [facility])    Dr Bernardo Heater  . Diverticulosis of colon 1996   on sig  . Hyperlipidemia   . Legal blindness   . Macular degeneration    legally blind  . Osteoporosis   . Pain    chronic pain left chest x years. has subclavian block left arm. has never used ntg  . PVD (peripheral vascular disease) ([place])   . 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 Unspecified hypothyroidism     Past Surgical History:  Procedure Laterality Date  . BLADDER SURGERY  [phone]   bladder cancer removed- [facility], then [facility]  . CATARACT EXTRACTION    . KYPHOPLASTY N/A 09/18/2016   Procedure: [id number];  Surgeon: Hessie Knows, MD;  Location: [facility] ORS;  Service: Orthopedics;  Laterality: N/A;  . TONSILLECTOMY       Home Meds: Prior to Admission medications   Medication Sig Start Date End Date Taking? Authorizing Provider  aspirin 81 MG tablet Take 81 mg by mouth daily.     Yes [provider]  levothyroxine (SYNTHROID, LEVOTHROID) 125 MCG tablet TAKE 1 TABLET BY MOUTH ONCE DAILY BEFOREBREAKFAST 02/23/17  Yes Venia Carbon, MD  Probiotic Product (PROBIOTIC PO) Take 1 tablet by mouth daily.   Yes [provider]  traMADol (ULTRAM) 50 MG tablet TAKE 1 TABLET BY MOUTH 3 TIMES DAILY AS NEEDED 03/31/17  Yes Venia Carbon, MD  acetaminophen (TYLENOL) 500 MG tablet Take 500 mg by mouth every 6 (six) hours as needed.    [provider]  LORazepam (ATIVAN) 0.5 MG tablet TAKE 1/2-1 TABLET  BY MOUTH AT BEDTIME ASNEEDED FOR ANXIETY Patient not taking: Reported on 04/07/2017 03/31/17   Venia Carbon, MD  nitroGLYCERIN (NITROSTAT) 0.4 MG SL tablet Place 1 tablet (0.4 mg total) under the tongue every 5 (five) minutes as needed for chest pain. 01/09/15   Venia Carbon, MD  omeprazole (PRILOSEC) 20 MG capsule Take 1 capsule (20 mg total) by mouth daily. Patient not taking: Reported on 04/07/2017 09/17/16   Viviana Simpler I, MD  polyethylene glycol powder (GLYCOLAX/MIRALAX) powder TAKE 17GM (LINE INSIDE CAP)  IN EIGHT OUNCES OF WATER OR OTHER LIQUID DAILY Patient not taking: Reported on 04/07/2017 11/27/16   Venia Carbon, MD    Inpatient Medications: Scheduled Meds: . aspirin EC  81 mg Oral Daily  . docusate sodium  100 mg Oral BID  . famotidine  20 mg Oral Daily  . gi cocktail  30 mL Oral Once  . heparin  5,000 Units Subcutaneous Q8H  . levothyroxine  125 mcg Oral QAC breakfast  . LORazepam  0.5 mg Oral QHS   Continuous Infusions:  PRN Meds: acetaminophen **OR** acetaminophen, alum & mag hydroxide-simeth, morphine injection, nitroGLYCERIN, ondansetron **OR** ondansetron (ZOFRAN) IV  Allergies:  No Known Allergies  Social History:   Social History   Socioeconomic History  . Marital status: Widowed    Spouse name: Not on file  . Number of children: 0  . Years of education: Not on file  . Highest education level: Not on file  Social Needs  . Financial resource strain: Not on file  . Food insecurity - worry: Not on file  . Food insecurity - inability: Not on file  . Transportation needs - medical: Not on file  . Transportation needs - non-medical: Not on file  Occupational History  . Not on file  Tobacco Use  . Smoking status: Former Smoker    Last attempt to quit: 09/08/1948    Years since quitting: 68.6  . Smokeless tobacco: Never Used  Substance and Sexual Activity  . Alcohol use: No    Alcohol/week: 0.0 oz  . Drug use: No  . Sexual activity: Not on file  Other Topics Concern  . Not on file  Social History Narrative   Widowed 11/08   No children   Doesn't drive   Has living will. Has DNR form already- wants this continued.    Request husbands niece [facility] or [place] as health care POA   Wouldn't want feeding tube     Family History:  Family History  Problem Relation Age of Onset  . Other Mother        enlarged heart  . Diabetes Unknown        strong in family  . Heart attack Brother 76  . Diabetes Brother   . Cancer Brother        bladder ca  .  Hypertension Neg Hx   . Breast cancer Neg Hx   . Colon cancer Neg Hx     ROS:  Review of Systems  Constitutional: Positive for malaise/fatigue. Negative for chills, diaphoresis, fever and weight loss.  HENT: Negative for congestion.   Eyes: Negative for discharge and redness.  Respiratory: Positive for shortness of breath. Negative for cough, hemoptysis, sputum production and wheezing.   Cardiovascular: Positive for chest pain. Negative for palpitations, orthopnea, claudication, leg swelling and PND.  Gastrointestinal: Positive for heartburn. Negative for abdominal pain, blood in stool, constipation, diarrhea, melena, nausea and vomiting.  Genitourinary: Negative for hematuria.  Musculoskeletal: Negative for falls and myalgias.  Skin: Negative for rash.  Neurological: Positive for weakness. Negative for dizziness, tingling, tremors, sensory change, speech change, focal weakness and loss of consciousness.  Endo/Heme/Allergies: Does not bruise/bleed easily.  Psychiatric/Behavioral: Negative for substance abuse. The patient is not nervous/anxious.   All other systems reviewed and are negative.     Physical Exam/Data:   Vitals:   04/07/17 2100 04/07/17 2157 04/08/17 0353 04/08/17 0500  BP: (!) 138/52 125/66 (!) 119/31   Pulse: 68 76 79   Resp: (!) 26 18    Temp:  98.4 F (36.9 C) 97.8 F (36.6 C)   TempSrc:  Oral Oral   SpO2: 97% 98% 95%   Weight:  152 lb 14.4 oz (69.4 kg)  151 lb 14.4 oz (68.9 kg)  Height:        Intake/Output Summary (Last 24 hours) at 04/08/2017 0734 Last data filed at 04/08/2017 0353 Gross per 24 hour  Intake -  Output 700 ml  Net -700 ml   Filed Weights   04/07/17 1408 04/07/17 2157 04/08/17 0500  Weight: 155 lb (70.3 kg) 152 lb 14.4 oz (69.4 kg) 151 lb 14.4 oz (68.9 kg)   Body mass index is 26.91 kg/m.   Physical Exam: General: Elderly and frail appearing, in no acute distress. Head: Normocephalic, atraumatic, sclera non-icteric, no xanthomas,  nares without discharge.  Neck: Negative for carotid bruits. JVD not elevated. Lungs: Diminished breath sounds bilaterally with diffuse crackles. Breathing is unlabored. Heart: RRR with S1 S2. No murmurs, rubs, or gallops appreciated. Abdomen: Soft, non-tender, non-distended with normoactive bowel sounds. No hepatomegaly. No rebound/guarding. No obvious abdominal masses. Msk:  Strength and tone appear normal for age. Extremities: No clubbing or cyanosis. No edema. Distal pedal pulses are 2+ and equal bilaterally. Neuro: Alert and oriented X 3. No facial asymmetry. No focal deficit. Moves all extremities spontaneously. Psych:  Responds to questions appropriately with a normal affect.   EKG:  The EKG was personally reviewed and demonstrates: NSR, 83 bpm, prior anteroseptal infarct, no acute st/t changes Telemetry:  Telemetry was personally reviewed and demonstrates: NSR  Weights: Filed Weights   04/07/17 1408 04/07/17 2157 04/08/17 0500  Weight: 155 lb (70.3 kg) 152 lb 14.4 oz (69.4 kg) 151 lb 14.4 oz (68.9 kg)    Relevant CV Studies: TTE pending  Laboratory Data:  Chemistry Recent Labs  Lab 04/07/17 1441  NA 135  K 4.5  CL 104  CO2 20*  GLUCOSE 113*  BUN 26*  CREATININE 1.02*  CALCIUM 8.7*  GFRNONAA 46*  GFRAA 53*  ANIONGAP 11    Recent Labs  Lab 04/07/17 1737  PROT 6.9  ALBUMIN 3.2*  AST 20  ALT 12*  ALKPHOS 92  BILITOT 1.0   Hematology Recent Labs  Lab 04/07/17 1441  WBC 7.8  RBC 4.29  HGB 12.9  HCT 38.5  MCV 89.8  MCH 30.0  MCHC 33.5  RDW 14.4  PLT 196   Cardiac Enzymes Recent Labs  Lab 04/07/17 0002 04/07/17 1441 04/07/17 1737 04/08/17 0602  TROPONINI <0.03 0.03* <0.03 <0.03   No results for input(s): TROPIPOC in the last 168 hours.  BNPNo results for input(s): BNP, PROBNP in the last 168 hours.  DDimer No results for input(s): DDIMER in the last 168 hours.  Radiology/Studies:  Dg Chest Port 1 View  Result Date:  04/07/2017 IMPRESSION: Aortic atherosclerosis. Diffuse reticulonodular densities are noted throughout both lungs which may represent chronic interstitial lung disease or scarring,  but superimposed acute edema or inflammation cannot be excluded. Electronically Signed   By: Marijo Conception, M.D.   On: 04/07/2017 15:28    Assessment and Plan:   1. Chest pain with moderate risk of cardiac etiology: -Currently denies chest pain or SOB, though is requiring supplemental oxygen -Family and patient are less inclined to want to proceed with ischemic evaluation at this time given her advanced age and comorbid conditions -Will obtain an echo this morning to assess her EF and wall motion, if normal, would not pursue any further inpatient ischemic evaluation given patient and family wishes, which sounds reasonable -If echo shows reduced EF or WMA concerning for ischemia family may want to re-visit ischemic evaluation  -ASA  2. Dyspnea: -Supplemental oxygen to maintain O2 > 95% -Echo pending -Check BNP -Possible interstitial lung disease noted on CXR, defer to IM  3. Hypothyroidism: -TSH normal -On replacement therapy  4. AKI: -Monitor  5. Dispo: -Family asks the patient be admitted for "2-3 days"   For questions or updates, please contact [place] Please consult [email or web address] for contact info under Cardiology/STEMI.   Signed, Christell Faith, PA-C [facility] Pager: [phone] 04/08/2017, 7:34 AM

## 2017-04-08 NOTE — Progress Notes (Signed)
Advance care planning  Met with patient and her healthcare power of attorney niece at bedside.  Discussed regarding patient's chest pain, pulmonary fibrosis on CT scan and possible bronchitis.  Patient lives alone and has a sedentary lifestyle.  Her healthcare power of attorney are both nieces   Present at bedside. Discussed regarding patient's advanced age and workup for chest pain.  Family is not wanting cardiac catheterization or CABG. Advised treating pulmonary fibrosis and possible acute bronchitis which could be the cause of chest pain.  We discussed regarding CODE STATUS And patient does not want any ventilatory support or CPR.  Niece at bedside agrees.  DNR and DNI orders entered.    Time spent 20 minutes.

## 2017-04-08 NOTE — Progress Notes (Signed)
At Rest    04/08/17 0748  Vitals  MAP (mmHg) (!) 61  BP Method Automatic  Pulse Rate 69  Pulse Rate Source Monitor  Oxygen Therapy  SpO2 (!) 84 %  O2 Device Room [employer]

## 2017-04-08 NOTE — Progress Notes (Signed)
ANTIBIOTIC CONSULT NOTE - INITIAL  Pharmacy Consult for levofloxacin Indication: copd/cap  No Known Allergies  Patient Measurements: Height: 5\' 3"  (160 cm) Weight: 151 lb 14.4 oz (68.9 kg) IBW/kg (Calculated) : 52.4 Adjusted Body Weight:   Vital Signs: Temp: 97.7 F (36.5 C) (11/21 0748) Temp Source: Oral (11/21 0748) BP: 103/56 (11/21 1045) Pulse Rate: 77 (11/21 1045) Intake/Output from previous day: 11/20 0701 - 11/21 0700 In: -  Out: 700 [Urine:700] Intake/Output from this shift: Total I/O In: 480 [P.O.:480] Out: -   Labs: Recent Labs    04/07/17 1441  WBC 7.8  HGB 12.9  PLT 196  CREATININE 1.02*   Estimated Creatinine Clearance: 32.1 mL/min (A) (by C-G formula based on SCr of 1.02 mg/dL (H)). No results for input(s): VANCOTROUGH, VANCOPEAK, VANCORANDOM, GENTTROUGH, GENTPEAK, GENTRANDOM, TOBRATROUGH, TOBRAPEAK, TOBRARND, AMIKACINPEAK, AMIKACINTROU, AMIKACIN in the last 72 hours.   Microbiology: Recent Results (from the past 720 hour(s))  MRSA PCR Screening     Status: None   Collection Time: 04/07/17 10:13 PM  Result Value Ref Range Status   MRSA by PCR NEGATIVE NEGATIVE Final    Comment:        The GeneXpert MRSA Assay (FDA approved for NASAL specimens only), is one component of a comprehensive MRSA colonization surveillance program. It is not intended to diagnose MRSA infection nor to guide or monitor treatment for MRSA infections.     Medical History: Past Medical History:  Diagnosis Date  . Aortic atherosclerosis ([place])   . Bladder cancer [facility])    Dr Bernardo Heater  . Diverticulosis of colon 1996   on sig  . Hyperlipidemia   . Legal blindness   . Macular degeneration    legally blind  . Osteoporosis   . Pain    chronic pain left chest x years. has subclavian block left arm. has never used ntg  . PVD (peripheral vascular disease) ([place])   . 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 Unspecified hypothyroidism      Medications:   Assessment: Pharmacy to dose and monitor levofloxacin in this 83 year old woman being treated for copd/cap.   Goal of Therapy:    Plan:  Will start levofloxacin 750 mg PO q48 hours.    Layal Javid D 04/08/2017,3:37 PM

## 2017-04-09 DIAGNOSIS — R079 Chest pain, unspecified: Secondary | ICD-10-CM | POA: Diagnosis not present

## 2017-04-09 DIAGNOSIS — J849 Interstitial pulmonary disease, unspecified: Secondary | ICD-10-CM

## 2017-04-09 DIAGNOSIS — I700 Atherosclerosis of aorta: Secondary | ICD-10-CM

## 2017-04-09 DIAGNOSIS — I25118 Atherosclerotic heart disease of native coronary artery with other forms of angina pectoris: Secondary | ICD-10-CM

## 2017-04-09 DIAGNOSIS — K219 Gastro-esophageal reflux disease without esophagitis: Secondary | ICD-10-CM | POA: Diagnosis not present

## 2017-04-09 DIAGNOSIS — N183 Chronic kidney disease, stage 3 (moderate): Secondary | ICD-10-CM | POA: Diagnosis not present

## 2017-04-09 DIAGNOSIS — E039 Hypothyroidism, unspecified: Secondary | ICD-10-CM | POA: Diagnosis not present

## 2017-04-09 DIAGNOSIS — R0902 Hypoxemia: Secondary | ICD-10-CM

## 2017-04-09 DIAGNOSIS — J8410 Pulmonary fibrosis, unspecified: Secondary | ICD-10-CM | POA: Diagnosis not present

## 2017-04-09 MED ORDER — LEVOFLOXACIN 750 MG PO TABS: 750.0000 mg | ORAL_TABLET | ORAL | 0 refills | Status: DC

## 2017-04-09 MED ORDER — PREDNISONE 50 MG PO TABS: 50.0000 mg | ORAL_TABLET | Freq: Every day | ORAL | 0 refills | Status: DC

## 2017-04-09 NOTE — Progress Notes (Signed)
Pt discharged home with home health. Pt qualified for home O2. O2 tanks provided by [facility] home [facility].reviewed how to use tanks with patient and her daughter. Reviewed discharge paperwork and instructions with the patient and her daughter. IV's and heart monitor removed. Pt stable @ time of D/C. Wheeled out by nurse tech to family vehicle via wheelchair.

## 2017-04-09 NOTE — Progress Notes (Signed)
Progress Note  Patient Name: Tracy Matthews Date of Encounter: 04/09/2017  Primary Cardiologist: New to [facility], Ramar Nobrega  Subjective   Reports that she feels better this morning on oxygen Eating a hamburger, no problems swallowing Denies having any further chest pain Scheduled to go home with oxygen Nursing notes reviewed showing hypoxia on room air with exertion down into the 80s (84% documented)  CT scan chest reviewed showing interstitial lung disease diffusely No PE   Inpatient Medications    Scheduled Meds: . aspirin EC  81 mg Oral Daily  . docusate sodium  100 mg Oral BID  . famotidine  20 mg Oral Daily  . gi cocktail  30 mL Oral Once  . heparin  5,000 Units Subcutaneous Q8H  . ipratropium-albuterol  3 mL Nebulization Q6H  . [START ON 04/10/2017] levofloxacin  750 mg Oral Q48H  . levothyroxine  125 mcg Oral QAC breakfast  . LORazepam  0.5 mg Oral QHS  . predniSONE  50 mg Oral Q breakfast   Continuous Infusions:  PRN Meds: acetaminophen **OR** acetaminophen, alum & mag hydroxide-simeth, nitroGLYCERIN, ondansetron **OR** ondansetron (ZOFRAN) IV   Vital Signs    Vitals:   04/09/17 0208 04/09/17 0611 04/09/17 0612 04/09/17 0737  BP:  (!) 125/47  (!) 145/62  Pulse:  65  71  Resp:  17  18  Temp:   (!) 97.5 F (36.4 C) (!) 97.4 F (36.3 C)  TempSrc:   Oral Oral  SpO2:  91%  97%  Weight: 150 lb 3.2 oz (68.1 kg)     Height:        Intake/Output Summary (Last 24 hours) at 04/09/2017 1444 Last data filed at 04/09/2017 0959 Gross per 24 hour  Intake 720 ml  Output 475 ml  Net 245 ml   Filed Weights   04/07/17 2157 04/08/17 0500 04/09/17 0208  Weight: 152 lb 14.4 oz (69.4 kg) 151 lb 14.4 oz (68.9 kg) 150 lb 3.2 oz (68.1 kg)    Telemetry    Normal sinus rhythm- Personally Reviewed  ECG      Physical Exam   GEN: No acute distress.   Neck: No JVD Cardiac: RRR, no murmurs, rubs, or gallops.  Respiratory:  Rales bilaterally GI: Soft, nontender,  non-distended  MS: No edema; No deformity. Neuro:  Nonfocal  Psych: Normal affect   Labs    Chemistry Recent Labs  Lab 04/07/17 1441 04/07/17 1737  NA 135  --   K 4.5  --   CL 104  --   CO2 20*  --   GLUCOSE 113*  --   BUN 26*  --   CREATININE 1.02*  --   CALCIUM 8.7*  --   PROT  --  6.9  ALBUMIN  --  3.2*  AST  --  20  ALT  --  12*  ALKPHOS  --  92  BILITOT  --  1.0  GFRNONAA 46*  --   GFRAA 53*  --   ANIONGAP 11  --      Hematology Recent Labs  Lab 04/07/17 1441  WBC 7.8  RBC 4.29  HGB 12.9  HCT 38.5  MCV 89.8  MCH 30.0  MCHC 33.5  RDW 14.4  PLT 196    Cardiac Enzymes Recent Labs  Lab 04/07/17 0002 04/07/17 1441 04/07/17 1737 04/08/17 0602  TROPONINI <0.03 0.03* <0.03 <0.03   No results for input(s): TROPIPOC in the last 168 hours.   BNP Recent Labs  Lab  04/08/17 0002  BNP 157.0*     DDimer No results for input(s): DDIMER in the last 168 hours.   Radiology    Ct Angio Chest Pe W Or Wo Contrast  Result Date: 04/08/2017 CLINICAL DATA:  Substernal chest pain for the past 2-3 weeks with increasing dyspnea. EXAM: CT ANGIOGRAPHY CHEST WITH CONTRAST TECHNIQUE: Multidetector CT imaging of the chest was performed using the standard protocol during bolus administration of intravenous contrast. Multiplanar CT image reconstructions and MIPs were obtained to evaluate the vascular anatomy. CONTRAST:  77mL ISOVUE-370 IOPAMIDOL (ISOVUE-370) INJECTION 76% COMPARISON:  CXR 04/07/2017 FINDINGS: Cardiovascular: Mild cardiomegaly without pericardial effusion. Coronary arteriosclerosis along the circumflex and LAD. Moderate aortic atherosclerosis without aneurysm or dissection. Satisfactory opacification of the pulmonary arterial system without pulmonary embolus to the segmental level. Normal branch pattern of the great vessels with atherosclerotic calcifications noted proximally. Mediastinum/Nodes: No pathologically enlarged mediastinal, hilar, or axillary lymph  nodes. Thyroid gland, trachea, and esophagus demonstrate no significant findings. Lungs/Pleura: Scattered subpleural areas of interstitial fibrosis with superimposed nonspecific ground-glass opacities throughout both lungs. Findings may reflect an atypical pneumonia, alveolitis/ pneumonitis and/or hypoventilatory change. Stigmata of pulmonary edema is not entirely excluded. No effusion or pneumothorax is noted however. Minimal lower lobe bronchiectasis. 3 mm right upper lobe pulmonary nodular density, series 6, image 20. Upper Abdomen: Small hiatal hernia. No acute abnormality within the upper abdomen. Minimal diverticulosis of the included left colon. Musculoskeletal: Mild degenerative change along the dorsal spine with probable bone island of the T7 vertebral body. Review of the MIP images confirms the above findings. IMPRESSION: 1. No acute pulmonary embolus. 2. Diffuse bilateral subpleural areas of reticular interstitial prominence consistent with interstitial fibrosis. 3. Superimposed ground-glass opacities throughout all lobes of the lung with differential possibilities that may include an atypical pneumonia, alveolitis/ pneumonitis, hypoventilatory change or stigmata of pulmonary edema among more common etiologies. Minimal bilateral lower lobe bronchiectasis. 4. Cardiomegaly with coronary arteriosclerosis. 5. Aortic atherosclerosis. 6. 3 mm right upper lobe pulmonary nodule. No follow-up needed if patient is low-risk. Non-contrast chest CT can be considered in 12 months if patient is high-risk. This recommendation follows the consensus statement: Guidelines for Management of Incidental Pulmonary Nodules Detected on CT Images: From the Fleischner Society 2017; Radiology 2017; 284:228-243. Aortic Atherosclerosis (ICD10-I70.0) Electronically Signed   By: Ashley Royalty M.D.   On: 04/08/2017 13:41   Dg Chest Port 1 View  Result Date: 04/07/2017 CLINICAL DATA:  Chest pain. EXAM: PORTABLE CHEST 1 VIEW COMPARISON:   None. FINDINGS: Mild cardiomegaly is noted. Atherosclerosis of thoracic aorta is noted. Diffuse reticulonodular densities are noted throughout both lungs which may represent chronic interstitial lung disease or scarring, but superimposed acute edema or inflammation cannot be excluded. No pneumothorax or pleural effusion is noted. Bony thorax is unremarkable. IMPRESSION: Aortic atherosclerosis. Diffuse reticulonodular densities are noted throughout both lungs which may represent chronic interstitial lung disease or scarring, but superimposed acute edema or inflammation cannot be excluded. Electronically Signed   By: Marijo Conception, M.D.   On: 04/07/2017 15:28    Cardiac Studies   Echocardiogram Normal LV function ejection fraction greater than 55% No significant valve disease Mildly elevated right heart pressures 44 mmHg  Patient Profile     83 year old woman with uncertain prior cardiac history, peripheral vascular disease,  aortic atherosclerosis, blind, prior smoker who presents with several weeks of worsening shortness of breath, chest discomfort Symptoms probably started approximately 1 month ago  Assessment & Plan    A/P: 1)  chest pain Atypical in nature,  Normal echocardiogram, normal EKG, cardiac enzymes negative CT scan with diffuse coronary calcifications Could consider outpatient stress testing if she continues to have chest pain symptoms No family at the bedside to discuss  2) shortness of breath  Normal ejection fraction, normal right heart pressures Less likely cardiac, Bilateral fibrosis, going home on oxygen   Total encounter time more than 25 minutes  Greater than 50% was spent in counseling and coordination of care with the patient   For questions or updates, please contact [place] Please consult [email or web address] for contact info under Cardiology/STEMI.      Signed, Ida Rogue, MD  04/09/2017, 2:44 PM

## 2017-04-09 NOTE — Progress Notes (Signed)
SATURATION QUALIFICATIONS: (This note is used to comply with regulatory documentation for home oxygen)  Patient Saturations on Room Air at Rest = 95%  Patient Saturations on Room Air while Ambulating = 84%  Patient Saturations on 3 Liters of oxygen while Ambulating = 93%   

## 2017-04-11 DIAGNOSIS — Z9981 Dependence on supplemental oxygen: Secondary | ICD-10-CM

## 2017-04-11 DIAGNOSIS — Z7982 Long term (current) use of aspirin: Secondary | ICD-10-CM

## 2017-04-11 DIAGNOSIS — Z87891 Personal history of nicotine dependence: Secondary | ICD-10-CM

## 2017-04-11 DIAGNOSIS — F419 Anxiety disorder, unspecified: Secondary | ICD-10-CM

## 2017-04-11 DIAGNOSIS — R0789 Other chest pain: Secondary | ICD-10-CM | POA: Diagnosis not present

## 2017-04-11 DIAGNOSIS — I739 Peripheral vascular disease, unspecified: Secondary | ICD-10-CM | POA: Diagnosis not present

## 2017-04-11 DIAGNOSIS — E785 Hyperlipidemia, unspecified: Secondary | ICD-10-CM | POA: Diagnosis not present

## 2017-04-11 DIAGNOSIS — I2510 Atherosclerotic heart disease of native coronary artery without angina pectoris: Secondary | ICD-10-CM | POA: Diagnosis not present

## 2017-04-11 DIAGNOSIS — H548 Legal blindness, as defined in USA: Secondary | ICD-10-CM | POA: Diagnosis not present

## 2017-04-11 DIAGNOSIS — G8929 Other chronic pain: Secondary | ICD-10-CM | POA: Diagnosis not present

## 2017-04-11 DIAGNOSIS — Z8589 Personal history of malignant neoplasm of other organs and systems: Secondary | ICD-10-CM

## 2017-04-11 DIAGNOSIS — E039 Hypothyroidism, unspecified: Secondary | ICD-10-CM | POA: Diagnosis not present

## 2017-04-11 DIAGNOSIS — K5790 Diverticulosis of intestine, part unspecified, without perforation or abscess without bleeding: Secondary | ICD-10-CM | POA: Diagnosis not present

## 2017-04-11 DIAGNOSIS — N183 Chronic kidney disease, stage 3 (moderate): Secondary | ICD-10-CM | POA: Diagnosis not present

## 2017-04-11 DIAGNOSIS — H3530 Unspecified macular degeneration: Secondary | ICD-10-CM | POA: Diagnosis not present

## 2017-04-11 DIAGNOSIS — R0600 Dyspnea, unspecified: Secondary | ICD-10-CM | POA: Diagnosis not present

## 2017-04-11 DIAGNOSIS — J8410 Pulmonary fibrosis, unspecified: Secondary | ICD-10-CM | POA: Diagnosis not present

## 2017-04-11 DIAGNOSIS — R32 Unspecified urinary incontinence: Secondary | ICD-10-CM

## 2017-04-11 DIAGNOSIS — M810 Age-related osteoporosis without current pathological fracture: Secondary | ICD-10-CM

## 2017-04-11 DIAGNOSIS — K219 Gastro-esophageal reflux disease without esophagitis: Secondary | ICD-10-CM

## 2017-04-13 DIAGNOSIS — R0789 Other chest pain: Secondary | ICD-10-CM | POA: Diagnosis not present

## 2017-04-13 DIAGNOSIS — J8410 Pulmonary fibrosis, unspecified: Secondary | ICD-10-CM | POA: Diagnosis not present

## 2017-04-14 ENCOUNTER — Ambulatory Visit (INDEPENDENT_AMBULATORY_CARE_PROVIDER_SITE_OTHER): Payer: Medicare Other | Admitting: Internal Medicine

## 2017-04-14 ENCOUNTER — Encounter: Payer: Self-pay | Admitting: Internal Medicine

## 2017-04-14 VITALS — BP 132/54 | HR 60 | Temp 97.6°F | Wt 156.5 lb

## 2017-04-14 DIAGNOSIS — J9611 Chronic respiratory failure with hypoxia: Secondary | ICD-10-CM

## 2017-04-14 DIAGNOSIS — R079 Chest pain, unspecified: Secondary | ICD-10-CM | POA: Diagnosis not present

## 2017-04-14 DIAGNOSIS — J8410 Pulmonary fibrosis, unspecified: Secondary | ICD-10-CM | POA: Diagnosis not present

## 2017-04-14 NOTE — Assessment & Plan Note (Signed)
Hard to tell if progressive and what the etiology is Is seeing pulmonologist soon On oxygen DOE is significant--- has around the clock care at her place

## 2017-04-14 NOTE — Assessment & Plan Note (Signed)
I doubt this is cardiac related Wouldn't pursue stress test, etc at this point

## 2017-04-14 NOTE — Assessment & Plan Note (Signed)
On oxygen all the time for now Might consider hospice if seems to be progressing

## 2017-04-14 NOTE — Progress Notes (Signed)
Subjective:    Patient ID: Tracy Matthews, female    DOB: Jun 12, 1921, 83 y.o.   MRN: [id number]  HPI Here for follow up of recent hospital stay Here with niece  Reviewed [facility] records, cardiology evaluation, radiographic studies  Notes that her mild SOB probably has been slowly progressive over months 1 week ago-- got up and "felt so bad" Went to see RN at [facility] having SOB so 911 activated Some chest pain at that time also  Still with some chest discomfort--upper mid chest Head will feel woozy and has to lie down Breathing is some better--but still DOE with minor activity--- walking in house,etc Using the oxygen 24/7 "I just feel old" Does have appt with pulmonologist   Walking with rollator Has aide 24/7 now Mostly stand by help---doing shopping, cooking, instrumental ADLs  Current Outpatient Medications on File Prior to Visit  Medication Sig Dispense Refill  . acetaminophen (TYLENOL) 500 MG tablet Take 500 mg by mouth every 6 (six) hours as needed.    Marland Kitchen aspirin 81 MG tablet Take 81 mg by mouth daily.      Marland Kitchen levothyroxine (SYNTHROID, LEVOTHROID) 125 MCG tablet TAKE 1 TABLET BY MOUTH ONCE DAILY BEFOREBREAKFAST 90 tablet 0  . LORazepam (ATIVAN) 0.5 MG tablet TAKE 1/2-1 TABLET BY MOUTH AT BEDTIME ASNEEDED FOR ANXIETY 30 tablet 0  . nitroGLYCERIN (NITROSTAT) 0.4 MG SL tablet Place 1 tablet (0.4 mg total) under the tongue every 5 (five) minutes as needed for chest pain. 25 tablet 1  . omeprazole (PRILOSEC) 20 MG capsule Take 1 capsule (20 mg total) by mouth daily. 30 capsule 3  . polyethylene glycol powder (GLYCOLAX/MIRALAX) powder TAKE 17GM (LINE INSIDE CAP) IN EIGHT OUNCES OF WATER OR OTHER LIQUID DAILY 527 g 5  . Probiotic Product (PROBIOTIC PO) Take 1 tablet by mouth daily.    . traMADol (ULTRAM) 50 MG tablet TAKE 1 TABLET BY MOUTH 3 TIMES DAILY AS NEEDED 90 tablet 0   No current facility-administered medications on file prior to visit.     No Known  Allergies  Past Medical History:  Diagnosis Date  . Aortic atherosclerosis ([place])   . Bladder cancer [facility])    Dr Bernardo Heater  . Diverticulosis of colon 1996   on sig  . Hyperlipidemia   . Legal blindness   . Macular degeneration    legally blind  . Osteoporosis   . Pain    chronic pain left chest x years. has subclavian block left arm. has never used ntg  . Pulmonary fibrosis, unspecified ([place])   . PVD (peripheral vascular disease) ([place])   . 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 Unspecified hypothyroidism     Past Surgical History:  Procedure Laterality Date  . BLADDER SURGERY  [phone]   bladder cancer removed- [facility] Hart, then [facility]  . CATARACT EXTRACTION    . KYPHOPLASTY N/A 09/18/2016   Procedure: [id number];  Surgeon: Hessie Knows, MD;  Location: [facility] ORS;  Service: Orthopedics;  Laterality: N/A;  . TONSILLECTOMY      Family History  Problem Relation Age of Onset  . Other Mother        enlarged heart  . Diabetes Unknown        strong in family  . Heart attack Brother 48  . Diabetes Brother   . Cancer Brother        bladder ca  . Hypertension Neg Hx   . Breast cancer Neg Hx   .  Colon cancer Neg Hx     Social History   Socioeconomic History  . Marital status: Widowed    Spouse name: Not on file  . Number of children: 0  . Years of education: Not on file  . Highest education level: Not on file  Social Needs  . Financial resource strain: Not on file  . Food insecurity - worry: Not on file  . Food insecurity - inability: Not on file  . Transportation needs - medical: Not on file  . Transportation needs - non-medical: Not on file  Occupational History  . Not on file  Tobacco Use  . Smoking status: Former Smoker    Last attempt to quit: 09/08/1948    Years since quitting: 68.6  . Smokeless tobacco: Never Used  Substance and Sexual Activity  . Alcohol use: No    Alcohol/week: 0.0 oz  . Drug use: No  . Sexual  activity: Not on file  Other Topics Concern  . Not on file  Social History Narrative   Widowed 11/08   No children   Doesn't drive   Has living will. Has DNR form already- wants this continued.    Request husbands niece [facility] or [place] as health care POA   Wouldn't want feeding tube   Review of Systems Some back pain with activity as well Appetite is fair Weight fairly stable overall Sleeps off and on during the day in recliner-- and bed at night (variable)    Objective:   Physical Exam  Neck: No thyromegaly present.  Cardiovascular: Normal rate, regular rhythm and normal heart sounds. Exam reveals no gallop.  No murmur heard. Pulmonary/Chest: Effort normal. No respiratory distress. She has no wheezes.  Very slight dry crackles--mostly anterior (but very faint)  Musculoskeletal: She exhibits no edema.  Lymphadenopathy:    She has no cervical adenopathy.          Assessment & Plan:

## 2017-04-15 ENCOUNTER — Ambulatory Visit: Payer: Self-pay

## 2017-04-15 DIAGNOSIS — J8410 Pulmonary fibrosis, unspecified: Secondary | ICD-10-CM | POA: Diagnosis not present

## 2017-04-15 DIAGNOSIS — R0789 Other chest pain: Secondary | ICD-10-CM | POA: Diagnosis not present

## 2017-04-15 NOTE — Telephone Encounter (Signed)
Pt is refusing to take the medicine (nitroglycerin) despite many attempts by [employer] RN [facility] health ). Pt has chest pain that occurs when she gets up to walk then subsides 10 minutes after she sits down. Does not radiate but is having nausea with the chest pain.  Pt refusing to go to ED as advised. Will forward to Dr Alla German office at [facility] Reason for Disposition . [1] Intermittent  chest pain or "angina" AND [2] increasing in severity or frequency  (Exception: pains lasting a few seconds)  Answer Assessment - Initial Assessment Questions 1. LOCATION: "Where does it hurt?"       Mid sternum no radiation 2. RADIATION: "Does the pain go anywhere else?" (e.g., into neck, jaw, arms, back)     No radiation 3. ONSET: "When did the chest pain begin?" (Minutes, hours or days)      Only when walking 4. PATTERN "Does the pain come and go, or has it been constant since it started?"  "Does it get worse with exertion?"      Comes and goes  5. DURATION: "How long does it last" (e.g., seconds, minutes, hours)     Subsides after she sits down about 10 minutes 6. SEVERITY: "How bad is the pain?"  (e.g., Scale 1-10; mild, moderate, or severe)    - MILD (1-3): doesn't interfere with normal activities     - MODERATE (4-7): interferes with normal activities or awakens from sleep    - SEVERE (8-10): excruciating pain, unable to do any normal activities       Unable to rate 7. CARDIAC RISK FACTORS: "Do you have any history of heart problems or risk factors for heart disease?" (e.g., prior heart attack, angina; high blood pressure, diabetes, being overweight, high cholesterol, smoking, or strong family history of heart disease)     angina 8. PULMONARY RISK FACTORS: "Do you have any history of lung disease?"  (e.g., blood clots in lung, asthma, emphysema, birth control pills)     Pulmonary fibrosis stays on oxygen  2 LPM sats 92% 9. CAUSE: "What do you think is causing the chest pain?"     Per Inez Catalina  "blocked artery" 10. OTHER SYMPTOMS: "Do you have any other symptoms?" (e.g., dizziness, nausea, vomiting, sweating, fever, difficulty breathing, cough)       Nausea with chest pain 11. PREGNANCY: "Is there any chance you are pregnant?" "When was your last menstrual period?"       n/a  Protocols used: CHEST PAIN-A-AH

## 2017-04-15 NOTE — Discharge Summary (Signed)
[place] [facility] [place] NAME: Tracy Matthews    MR#:  [id number]  DATE OF BIRTH:  1921/12/23  DATE OF ADMISSION:  04/07/2017 ADMITTING PHYSICIAN: Harrie Foreman, MD  DATE OF DISCHARGE: 04/09/2017  5:56 PM  PRIMARY CARE PHYSICIAN: Venia Carbon, MD   ADMISSION DIAGNOSIS:  Chest pain, unspecified type [R07.9]  DISCHARGE DIAGNOSIS:  Active Problems:   Chest pain  SECONDARY DIAGNOSIS:   Past Medical History:  Diagnosis Date  . Aortic atherosclerosis ([place])   . Bladder cancer [facility])    Dr Bernardo Heater  . Diverticulosis of colon 1996   on sig  . Hyperlipidemia   . Legal blindness   . Macular degeneration    legally blind  . Osteoporosis   . Pain    chronic pain left chest x years. has subclavian block left arm. has never used ntg  . Pulmonary fibrosis, unspecified ([place])   . PVD (peripheral vascular disease) ([place])   . 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 Unspecified hypothyroidism      ADMITTING HISTORY  Chief Complaint: Pain HPI: The patient with past medical history of macular degeneration, hypothyroidism as well as peripheral vascular disease presents emergency department via EMS from her nursing home after complaining of chest pain.  The patient has had gradually increasingly bad episodes of chest pain.  Patient states the pain is substernal and feels like heartburn but much worse.  The pain is associated with shortness of breath which is also become  worse over the last week.  Due to ongoing chest pain as well as dyspnea emergency department staff called the hospitalist service for admission.    HOSPITAL COURSE:   *Acute bronchitis with pulmonary fibrosis and chronic hypoxic respiratory failure *Chest pain due to bronchitis  Patient was treated with IV steroids, antibiotics and nebulizers.  Oxygen set up for home use.  Seen by cardiology.  Normal ejection fraction and normal right heart  pressures on echocardiogram.  Patient was discharged home in stable condition with oxygen.  She did not want any aggressive treatments or surgeries.  Discussed with her healthcare power of attorney niece is at bedside.  Follow-up with primary care physician within a week.  Stable for discharge home with home health.  CONSULTS OBTAINED:  Treatment Team:  Minna Merritts, MD  DRUG ALLERGIES:  No Known Allergies  DISCHARGE MEDICATIONS:   Discharge Medication List as of 04/09/2017  3:33 PM    START taking these medications   Details  levofloxacin (LEVAQUIN) 750 MG tablet Take 1 tablet (750 mg total) by mouth every other day., Starting Fri 04/10/2017, Normal    predniSONE (DELTASONE) 50 MG tablet Take 1 tablet (50 mg total) by mouth daily with breakfast., Starting Thu 04/09/2017, Normal      CONTINUE these medications which have NOT CHANGED   Details  aspirin 81 MG tablet Take 81 mg by mouth daily.  , Historical Med    levothyroxine (SYNTHROID, LEVOTHROID) 125 MCG tablet TAKE 1 TABLET BY MOUTH ONCE DAILY BEFOREBREAKFAST, Normal    Probiotic Product (PROBIOTIC PO) Take 1 tablet by mouth daily., Historical Med    traMADol (ULTRAM) 50 MG tablet TAKE 1 TABLET BY MOUTH 3 TIMES DAILY AS NEEDED, Phone In    acetaminophen (TYLENOL) 500 MG tablet Take 500 mg by mouth every 6 (six) hours as needed., Historical Med    LORazepam (ATIVAN) 0.5 MG tablet TAKE 1/2-1 TABLET BY MOUTH  AT BEDTIME ASNEEDED FOR ANXIETY, Phone In    nitroGLYCERIN (NITROSTAT) 0.4 MG SL tablet Place 1 tablet (0.4 mg total) under the tongue every 5 (five) minutes as needed for chest pain., Starting Tue 01/09/2015, Normal    omeprazole (PRILOSEC) 20 MG capsule Take 1 capsule (20 mg total) by mouth daily., Starting Wed 09/17/2016, Normal    polyethylene glycol powder (GLYCOLAX/MIRALAX) powder TAKE 17GM (LINE INSIDE CAP) IN EIGHT OUNCES OF WATER OR OTHER LIQUID DAILY, Normal        Today   VITAL SIGNS:  Blood pressure  (!) 145/62, pulse 71, temperature (!) 97.4 F (36.3 C), temperature source Oral, resp. rate 18, height 5\' 3"  (1.6 m), weight 68.1 kg (150 lb 3.2 oz), SpO2 97 %.  I/O:  No intake or output data in the 24 hours ending 04/15/17 1214  PHYSICAL EXAMINATION:  Physical Exam  GENERAL:  83 y.o.-year-old patient lying in the bed with no acute distress.  LUNGS: Normal breath sounds bilaterally, no wheezing, rales,rhonchi or crepitation. No use of accessory muscles of respiration.  CARDIOVASCULAR: S1, S2 normal. No murmurs, rubs, or gallops.  ABDOMEN: Soft, non-tender, non-distended. Bowel sounds present. No organomegaly or mass.  NEUROLOGIC: Moves all 4 extremities. PSYCHIATRIC: The patient is alert and oriented x 3.  SKIN: No obvious rash, lesion, or ulcer.   DATA REVIEW:   CBC No results for input(s): WBC, HGB, HCT, PLT in the last 168 hours.  Chemistries  No results for input(s): NA, K, CL, CO2, GLUCOSE, BUN, CREATININE, CALCIUM, MG, AST, ALT, ALKPHOS, BILITOT in the last 168 hours.  Invalid input(s): GFRCGP  Cardiac Enzymes No results for input(s): TROPONINI in the last 168 hours.  Microbiology Results  Results for orders placed or performed during the hospital encounter of 04/07/17  MRSA PCR Screening     Status: None   Collection Time: 04/07/17 10:13 PM  Result Value Ref Range Status   MRSA by PCR NEGATIVE NEGATIVE Final    Comment:        The GeneXpert MRSA Assay (FDA approved for NASAL specimens only), is one component of a comprehensive MRSA colonization surveillance program. It is not intended to diagnose MRSA infection nor to guide or monitor treatment for MRSA infections.     RADIOLOGY:  No results found.  Follow up with PCP in 1 week.  Management plans discussed with the patient, family and they are in agreement.  CODE STATUS:  Code Status History    Date Active Date Inactive Code Status Order ID Comments User Context   04/07/2017 21:49 04/09/2017 21:02  Full Code [id number]  Harrie Foreman, MD Inpatient   09/18/2016 14:04 09/18/2016 17:40 Full Code [id number]  Hessie Knows, MD Inpatient      TOTAL TIME TAKING CARE OF THIS PATIENT ON DAY OF DISCHARGE: more than 30 minutes.   Neita Carp M.D on 04/15/2017 at 12:14 PM  Between 7am to 6pm - Pager - [phone]  After 6pm go to [email or web address] - password [email or web address] [facility]  Office  [phone]  CC: Primary care physician; Venia Carbon, MD  Note: This dictation was prepared with Dragon dictation along with smaller phrase technology. Any transcriptional errors that result from this process are unintentional.

## 2017-04-15 NOTE — Telephone Encounter (Signed)
Will review ER evaluation and then decide on the next steps

## 2017-04-15 NOTE — Telephone Encounter (Signed)
I spoke with Tracy Matthews and she has called 911 and ambulance is on its way now. FYI to Dr Silvio Pate.

## 2017-04-17 DIAGNOSIS — J8410 Pulmonary fibrosis, unspecified: Secondary | ICD-10-CM | POA: Diagnosis not present

## 2017-04-17 DIAGNOSIS — R0789 Other chest pain: Secondary | ICD-10-CM | POA: Diagnosis not present

## 2017-04-19 ENCOUNTER — Encounter: Payer: Self-pay | Admitting: Emergency Medicine

## 2017-04-19 ENCOUNTER — Other Ambulatory Visit: Payer: Self-pay

## 2017-04-19 ENCOUNTER — Emergency Department: Payer: Medicare Other

## 2017-04-19 ENCOUNTER — Inpatient Hospital Stay
Admission: EM | Admit: 2017-04-19 | Discharge: 2017-05-19 | DRG: 871 | Disposition: E | Payer: Medicare Other | Attending: Internal Medicine | Admitting: Internal Medicine

## 2017-04-19 DIAGNOSIS — Z7189 Other specified counseling: Secondary | ICD-10-CM | POA: Diagnosis not present

## 2017-04-19 DIAGNOSIS — Z7982 Long term (current) use of aspirin: Secondary | ICD-10-CM

## 2017-04-19 DIAGNOSIS — K5730 Diverticulosis of large intestine without perforation or abscess without bleeding: Secondary | ICD-10-CM | POA: Diagnosis present

## 2017-04-19 DIAGNOSIS — Z66 Do not resuscitate: Secondary | ICD-10-CM | POA: Diagnosis not present

## 2017-04-19 DIAGNOSIS — H548 Legal blindness, as defined in USA: Secondary | ICD-10-CM | POA: Diagnosis present

## 2017-04-19 DIAGNOSIS — J189 Pneumonia, unspecified organism: Secondary | ICD-10-CM | POA: Diagnosis present

## 2017-04-19 DIAGNOSIS — E871 Hypo-osmolality and hyponatremia: Secondary | ICD-10-CM | POA: Diagnosis present

## 2017-04-19 DIAGNOSIS — Z515 Encounter for palliative care: Secondary | ICD-10-CM | POA: Diagnosis not present

## 2017-04-19 DIAGNOSIS — Z5320 Procedure and treatment not carried out because of patient's decision for unspecified reasons: Secondary | ICD-10-CM | POA: Diagnosis present

## 2017-04-19 DIAGNOSIS — Z79899 Other long term (current) drug therapy: Secondary | ICD-10-CM | POA: Diagnosis not present

## 2017-04-19 DIAGNOSIS — J8410 Pulmonary fibrosis, unspecified: Secondary | ICD-10-CM | POA: Diagnosis present

## 2017-04-19 DIAGNOSIS — Z79890 Hormone replacement therapy (postmenopausal): Secondary | ICD-10-CM

## 2017-04-19 DIAGNOSIS — E039 Hypothyroidism, unspecified: Secondary | ICD-10-CM | POA: Diagnosis present

## 2017-04-19 DIAGNOSIS — I739 Peripheral vascular disease, unspecified: Secondary | ICD-10-CM | POA: Diagnosis present

## 2017-04-19 DIAGNOSIS — M810 Age-related osteoporosis without current pathological fracture: Secondary | ICD-10-CM | POA: Diagnosis present

## 2017-04-19 DIAGNOSIS — J9621 Acute and chronic respiratory failure with hypoxia: Secondary | ICD-10-CM | POA: Diagnosis present

## 2017-04-19 DIAGNOSIS — E872 Acidosis: Secondary | ICD-10-CM | POA: Diagnosis present

## 2017-04-19 DIAGNOSIS — I248 Other forms of acute ischemic heart disease: Secondary | ICD-10-CM | POA: Diagnosis not present

## 2017-04-19 DIAGNOSIS — E785 Hyperlipidemia, unspecified: Secondary | ICD-10-CM | POA: Diagnosis present

## 2017-04-19 DIAGNOSIS — G8929 Other chronic pain: Secondary | ICD-10-CM | POA: Diagnosis present

## 2017-04-19 DIAGNOSIS — R0682 Tachypnea, not elsewhere classified: Secondary | ICD-10-CM | POA: Diagnosis present

## 2017-04-19 DIAGNOSIS — Z9981 Dependence on supplemental oxygen: Secondary | ICD-10-CM

## 2017-04-19 DIAGNOSIS — R6521 Severe sepsis with septic shock: Secondary | ICD-10-CM | POA: Diagnosis present

## 2017-04-19 DIAGNOSIS — A419 Sepsis, unspecified organism: Principal | ICD-10-CM | POA: Diagnosis present

## 2017-04-19 DIAGNOSIS — Z8551 Personal history of malignant neoplasm of bladder: Secondary | ICD-10-CM | POA: Diagnosis not present

## 2017-04-19 DIAGNOSIS — R079 Chest pain, unspecified: Secondary | ICD-10-CM | POA: Diagnosis not present

## 2017-04-19 DIAGNOSIS — R0602 Shortness of breath: Secondary | ICD-10-CM

## 2017-04-19 DIAGNOSIS — I2510 Atherosclerotic heart disease of native coronary artery without angina pectoris: Secondary | ICD-10-CM | POA: Diagnosis present

## 2017-04-19 DIAGNOSIS — Z87891 Personal history of nicotine dependence: Secondary | ICD-10-CM

## 2017-04-19 DIAGNOSIS — I6389 Other cerebral infarction: Secondary | ICD-10-CM | POA: Diagnosis present

## 2017-04-19 DIAGNOSIS — G8194 Hemiplegia, unspecified affecting left nondominant side: Secondary | ICD-10-CM | POA: Diagnosis present

## 2017-04-19 DIAGNOSIS — R0789 Other chest pain: Secondary | ICD-10-CM | POA: Diagnosis not present

## 2017-04-19 DIAGNOSIS — J9600 Acute respiratory failure, unspecified whether with hypoxia or hypercapnia: Secondary | ICD-10-CM

## 2017-04-19 DIAGNOSIS — H3530 Unspecified macular degeneration: Secondary | ICD-10-CM | POA: Diagnosis present

## 2017-04-19 DIAGNOSIS — Y95 Nosocomial condition: Secondary | ICD-10-CM | POA: Diagnosis present

## 2017-04-19 DIAGNOSIS — R1310 Dysphagia, unspecified: Secondary | ICD-10-CM | POA: Diagnosis present

## 2017-04-19 DIAGNOSIS — Z9849 Cataract extraction status, unspecified eye: Secondary | ICD-10-CM

## 2017-04-19 LAB — COMPREHENSIVE METABOLIC PANEL
ALBUMIN: 2.5 g/dL — AB (ref 3.5–5.0)
ALK PHOS: 93 U/L (ref 38–126)
ALT: 11 U/L — AB (ref 14–54)
ANION GAP: 12 (ref 5–15)
AST: 25 U/L (ref 15–41)
BUN: 15 mg/dL (ref 6–20)
CALCIUM: 8 mg/dL — AB (ref 8.9–10.3)
CO2: 23 mmol/L (ref 22–32)
Chloride: 98 mmol/L — ABNORMAL LOW (ref 101–111)
Creatinine, Ser: 0.86 mg/dL (ref 0.44–1.00)
GFR calc Af Amer: >60 mL/min (ref >60)
GFR calc non Af Amer: 56 mL/min — ABNORMAL LOW (ref >60)
GLUCOSE: 115 mg/dL — AB (ref 65–99)
Potassium: 4.1 mmol/L (ref 3.5–5.1)
SODIUM: 133 mmol/L — AB (ref 135–145)
Total Bilirubin: 0.9 mg/dL (ref 0.3–1.2)
Total Protein: 6 g/dL — ABNORMAL LOW (ref 6.5–8.1)

## 2017-04-19 LAB — GLUCOSE, CAPILLARY: Glucose-Capillary: 94 mg/dL (ref 65–99)

## 2017-04-19 LAB — CBC WITH DIFFERENTIAL/PLATELET
BASOS ABS: 0.1 10*3/uL (ref 0–0.1)
BASOS PCT: 1 %
Eosinophils Absolute: 0.2 10*3/uL (ref 0–0.7)
Eosinophils Relative: 1 %
HCT: 37.7 % (ref 35.0–47.0)
HEMOGLOBIN: 12.6 g/dL (ref 12.0–16.0)
LYMPHS PCT: 3 %
Lymphs Abs: 0.4 10*3/uL — ABNORMAL LOW (ref 1.0–3.6)
MCH: 29.6 pg (ref 26.0–34.0)
MCHC: 33.4 g/dL (ref 32.0–36.0)
MCV: 88.8 fL (ref 80.0–100.0)
MONO ABS: 1.1 10*3/uL — AB (ref 0.2–0.9)
MONOS PCT: 7 %
NEUTROS ABS: 14.5 10*3/uL — AB (ref 1.4–6.5)
NEUTROS PCT: 88 %
Platelets: 210 10*3/uL (ref 150–440)
RBC: 4.25 MIL/uL (ref 3.80–5.20)
RDW: 14.4 % (ref 11.5–14.5)
WBC: 16.3 10*3/uL — ABNORMAL HIGH (ref 3.6–11.0)

## 2017-04-19 LAB — TROPONIN I
Troponin I: 0.06 ng/mL (ref <0.03)
Troponin I: 0.39 ng/mL (ref <0.03)
Troponin I: 0.12 ng/mL (ref <0.03)

## 2017-04-19 LAB — APTT: APTT: 34 s (ref 24–36)

## 2017-04-19 LAB — BRAIN NATRIURETIC PEPTIDE: B Natriuretic Peptide: 234 pg/mL — ABNORMAL HIGH (ref 0.0–100.0)

## 2017-04-19 LAB — LACTIC ACID, PLASMA
LACTIC ACID, VENOUS: 2.3 mmol/L — AB (ref 0.5–1.9)
Lactic Acid, Venous: 1.2 mmol/L (ref 0.5–1.9)

## 2017-04-19 LAB — PROTIME-INR
INR: 1.13
PROTHROMBIN TIME: 14.4 s (ref 11.4–15.2)

## 2017-04-19 LAB — MRSA PCR SCREENING: MRSA BY PCR: NEGATIVE

## 2017-04-19 LAB — HEPARIN LEVEL (UNFRACTIONATED): HEPARIN UNFRACTIONATED: 0.11 [IU]/mL — AB (ref 0.30–0.70)

## 2017-04-19 LAB — PROCALCITONIN: Procalcitonin: 0.21 ng/mL

## 2017-04-19 MED ORDER — HEPARIN BOLUS VIA INFUSION
4000.0000 [IU] | Freq: Once | INTRAVENOUS | Status: AC
  Administered 2017-04-19: 4000 [IU] via INTRAVENOUS
  Filled 2017-04-19: qty 4000

## 2017-04-19 MED ORDER — ATORVASTATIN CALCIUM 20 MG PO TABS
80.0000 mg | ORAL_TABLET | Freq: Every day | ORAL | Status: DC
  Filled 2017-04-19: qty 4

## 2017-04-19 MED ORDER — SODIUM CHLORIDE 0.9 % IV BOLUS (SEPSIS): 500.0000 mL | Freq: Once | INTRAVENOUS | Status: DC

## 2017-04-19 MED ORDER — ALBUTEROL SULFATE (2.5 MG/3ML) 0.083% IN NEBU: 2.5000 mg | INHALATION_SOLUTION | RESPIRATORY_TRACT | Status: DC | PRN

## 2017-04-19 MED ORDER — ASPIRIN 325 MG PO TABS
325.0000 mg | ORAL_TABLET | Freq: Every day | ORAL | Status: DC
  Filled 2017-04-19: qty 1

## 2017-04-19 MED ORDER — VANCOMYCIN HCL IN DEXTROSE 1-5 GM/200ML-% IV SOLN
1000.0000 mg | Freq: Once | INTRAVENOUS | Status: AC
  Administered 2017-04-19: 1000 mg via INTRAVENOUS
  Filled 2017-04-19: qty 200

## 2017-04-19 MED ORDER — CHLORHEXIDINE GLUCONATE 0.12 % MT SOLN
15.0000 mL | Freq: Two times a day (BID) | OROMUCOSAL | Status: DC
  Administered 2017-04-20 (×2): 15 mL via OROMUCOSAL
  Filled 2017-04-19: qty 15

## 2017-04-19 MED ORDER — FENTANYL CITRATE (PF) 100 MCG/2ML IJ SOLN
25.0000 ug | INTRAMUSCULAR | Status: DC | PRN
  Administered 2017-04-19: 50 ug via INTRAVENOUS
  Administered 2017-04-19: 25 ug via INTRAVENOUS
  Filled 2017-04-19 (×2): qty 2

## 2017-04-19 MED ORDER — ONDANSETRON HCL 4 MG PO TABS: 4.0000 mg | ORAL_TABLET | Freq: Four times a day (QID) | ORAL | Status: DC | PRN

## 2017-04-19 MED ORDER — PIPERACILLIN-TAZOBACTAM 3.375 G IVPB 30 MIN
3.3750 g | Freq: Once | INTRAVENOUS | Status: AC
  Administered 2017-04-19: 3.375 g via INTRAVENOUS
  Filled 2017-04-19: qty 50

## 2017-04-19 MED ORDER — ENOXAPARIN SODIUM 40 MG/0.4ML ~~LOC~~ SOLN: 40.0000 mg | SUBCUTANEOUS | Status: DC

## 2017-04-19 MED ORDER — SODIUM CHLORIDE 0.9 % IV BOLUS (SEPSIS)
500.0000 mL | Freq: Once | INTRAVENOUS | Status: AC
  Administered 2017-04-19: 500 mL via INTRAVENOUS

## 2017-04-19 MED ORDER — SODIUM CHLORIDE 0.9 % IV BOLUS (SEPSIS)
1000.0000 mL | Freq: Once | INTRAVENOUS | Status: AC
  Administered 2017-04-19: 1000 mL via INTRAVENOUS

## 2017-04-19 MED ORDER — MORPHINE SULFATE (PF) 2 MG/ML IV SOLN
2.0000 mg | INTRAVENOUS | Status: DC | PRN
  Administered 2017-04-19 – 2017-04-20 (×4): 4 mg via INTRAVENOUS
  Filled 2017-04-19 (×4): qty 2

## 2017-04-19 MED ORDER — TRAMADOL HCL 50 MG PO TABS
50.0000 mg | ORAL_TABLET | Freq: Three times a day (TID) | ORAL | Status: DC | PRN
  Filled 2017-04-19: qty 1

## 2017-04-19 MED ORDER — SODIUM CHLORIDE 0.9% FLUSH: 3.0000 mL | INTRAVENOUS | Status: DC | PRN

## 2017-04-19 MED ORDER — LEVOTHYROXINE SODIUM 50 MCG PO TABS: 125.0000 ug | ORAL_TABLET | Freq: Every day | ORAL | Status: DC

## 2017-04-19 MED ORDER — ORAL CARE MOUTH RINSE
15.0000 mL | Freq: Two times a day (BID) | OROMUCOSAL | Status: DC
  Administered 2017-04-20: 15 mL via OROMUCOSAL

## 2017-04-19 MED ORDER — RISAQUAD PO CAPS
ORAL_CAPSULE | Freq: Every day | ORAL | Status: DC
  Filled 2017-04-19: qty 1

## 2017-04-19 MED ORDER — SODIUM CHLORIDE 0.9 % IV SOLN: 250.0000 mL | INTRAVENOUS | Status: DC | PRN

## 2017-04-19 MED ORDER — MORPHINE SULFATE (PF) 2 MG/ML IV SOLN: 2.0000 mg | INTRAVENOUS | Status: DC | PRN

## 2017-04-19 MED ORDER — ASPIRIN 300 MG RE SUPP
300.0000 mg | Freq: Every day | RECTAL | Status: DC
  Administered 2017-04-19 – 2017-04-20 (×2): 300 mg via RECTAL
  Filled 2017-04-19 (×2): qty 1

## 2017-04-19 MED ORDER — HEPARIN BOLUS VIA INFUSION
2000.0000 [IU] | Freq: Once | INTRAVENOUS | Status: AC
  Administered 2017-04-19: 2000 [IU] via INTRAVENOUS
  Filled 2017-04-19: qty 2000

## 2017-04-19 MED ORDER — SODIUM CHLORIDE 0.9% FLUSH
3.0000 mL | Freq: Two times a day (BID) | INTRAVENOUS | Status: DC
  Administered 2017-04-19 – 2017-04-21 (×5): 3 mL via INTRAVENOUS

## 2017-04-19 MED ORDER — ACETAMINOPHEN 650 MG RE SUPP: 650.0000 mg | Freq: Four times a day (QID) | RECTAL | Status: DC | PRN

## 2017-04-19 MED ORDER — NITROGLYCERIN 0.4 MG SL SUBL: 0.4000 mg | SUBLINGUAL_TABLET | SUBLINGUAL | Status: DC | PRN

## 2017-04-19 MED ORDER — ONDANSETRON HCL 4 MG/2ML IJ SOLN: 4.0000 mg | Freq: Four times a day (QID) | INTRAMUSCULAR | Status: DC | PRN

## 2017-04-19 MED ORDER — ASPIRIN EC 81 MG PO TBEC: 81.0000 mg | DELAYED_RELEASE_TABLET | Freq: Every day | ORAL | Status: DC

## 2017-04-19 MED ORDER — LORAZEPAM 0.5 MG PO TABS: 0.5000 mg | ORAL_TABLET | Freq: Every evening | ORAL | Status: DC | PRN

## 2017-04-19 MED ORDER — SENNOSIDES-DOCUSATE SODIUM 8.6-50 MG PO TABS: 1.0000 | ORAL_TABLET | Freq: Every evening | ORAL | Status: DC | PRN

## 2017-04-19 MED ORDER — SODIUM CHLORIDE 0.9 % IV SOLN
INTRAVENOUS | Status: DC
  Administered 2017-04-19 – 2017-04-21 (×3): via INTRAVENOUS

## 2017-04-19 MED ORDER — PANTOPRAZOLE SODIUM 40 MG PO TBEC: 40.0000 mg | DELAYED_RELEASE_TABLET | Freq: Every day | ORAL | Status: DC

## 2017-04-19 MED ORDER — DEXTROSE 5 % IV SOLN
2.0000 g | Freq: Two times a day (BID) | INTRAVENOUS | Status: DC
  Administered 2017-04-19 – 2017-04-20 (×4): 2 g via INTRAVENOUS
  Filled 2017-04-19 (×6): qty 2

## 2017-04-19 MED ORDER — HEPARIN (PORCINE) IN NACL 100-0.45 UNIT/ML-% IJ SOLN
1050.0000 [IU]/h | INTRAMUSCULAR | Status: DC
  Administered 2017-04-19: 800 [IU]/h via INTRAVENOUS
  Administered 2017-04-20: 1050 [IU]/h via INTRAVENOUS
  Filled 2017-04-19 (×2): qty 250

## 2017-04-19 MED ORDER — BISACODYL 5 MG PO TBEC: 5.0000 mg | DELAYED_RELEASE_TABLET | Freq: Every day | ORAL | Status: DC | PRN

## 2017-04-19 MED ORDER — ACETAMINOPHEN 325 MG PO TABS: 650.0000 mg | ORAL_TABLET | Freq: Four times a day (QID) | ORAL | Status: DC | PRN

## 2017-04-19 MED ORDER — VANCOMYCIN HCL IN DEXTROSE 750-5 MG/150ML-% IV SOLN
750.0000 mg | INTRAVENOUS | Status: DC
  Administered 2017-04-19: 750 mg via INTRAVENOUS
  Filled 2017-04-19 (×2): qty 150

## 2017-04-19 NOTE — Progress Notes (Signed)
Pt is rating her chest pain at 9/10 and describes it as a constant ache. MD notified verbal order to administer 25-29mcg (for moderate to severe pain respectively) of IV fentayl Q2 hours PRN received.

## 2017-04-19 NOTE — Progress Notes (Addendum)
Pt's BP is dropping. Currently it is 09/628(36). MD notified verbal order to administer 500cc bolus of NS received.   Update 1850: When starting the IV bolus, pt informed me that her BP needs to be taken on the R arm due to hx of having low BPs in her L arm. Blood pressure cuff switched to L arm and BP recheck shows BP at 132/58 (80). Bolus not given. Extremity precaution band applied to L arm.

## 2017-04-19 NOTE — H&P (Signed)
[name] NAME: Tracy Matthews    MR#:  [id number]  DATE OF BIRTH:  [birth year]  DATE OF ADMISSION:  04/30/2017  PRIMARY CARE PHYSICIAN: Venia Carbon, MD   REQUESTING/REFERRING PHYSICIAN: Schuyler Amor, MD  CHIEF COMPLAINT:   Chief Complaint  Patient presents with  . Chest Pain   Chest pain HISTORY OF PRESENT ILLNESS:  Tracy Matthews  is a 83 y.o. female with a known history of chronic chest pain, chronic respiratory failure on home oxygen 2 L, pulmonary fibrosis, PVD, hyperlipidemia, diverticulosis and bladder cancer.  The patient was sent from independent living due to above chief complaints.  The patient has had chronic chest pain syndrome for years.  She was admitted for the same last months. CTA to did not show any PE. She is on home oxygen 2 L, but was found hypotension, tachypnea and hypoxia in the ED.  She is put on BiPAP and given normal saline bolus.  Blood pressure is still low at 70s.  PAST MEDICAL HISTORY:   Past Medical History:  Diagnosis Date  . Aortic atherosclerosis ([place])   . Bladder cancer [facility])    Dr Bernardo Heater  . Diverticulosis of colon 1996   on sig  . Hyperlipidemia   . Legal blindness   . Macular degeneration    legally blind  . Osteoporosis   . Pain    chronic pain left chest x years. has subclavian block left arm. has never used ntg  . Pulmonary fibrosis, unspecified ([place])   . PVD (peripheral vascular disease) ([place])   . 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 Unspecified hypothyroidism     PAST SURGICAL HISTORY:   Past Surgical History:  Procedure Laterality Date  . BLADDER SURGERY  [phone]   bladder cancer removed- [facility] Hart, then [facility]  . CATARACT EXTRACTION    . KYPHOPLASTY N/A 09/18/2016   Procedure: [id number];  Surgeon: Hessie Knows, MD;  Location: [facility] ORS;  Service: Orthopedics;  Laterality: N/A;  . TONSILLECTOMY      SOCIAL  HISTORY:   Social History   Tobacco Use  . Smoking status: Former Smoker    Last attempt to quit: 09/08/1948    Years since quitting: 68.6  . Smokeless tobacco: Never Used  Substance Use Topics  . Alcohol use: No    Alcohol/week: 0.0 oz    FAMILY HISTORY:   Family History  Problem Relation Age of Onset  . Other Mother        enlarged heart  . Diabetes Unknown        strong in family  . Heart attack Brother 44  . Diabetes Brother   . Cancer Brother        bladder ca  . Hypertension Neg Hx   . Breast cancer Neg Hx   . Colon cancer Neg Hx     DRUG ALLERGIES:  No Known Allergies  REVIEW OF SYSTEMS:   Review of Systems  Constitutional: Positive for malaise/fatigue. Negative for chills and fever.  HENT: Negative for sore throat.   Eyes: Negative for blurred vision and double vision.  Respiratory: Positive for shortness of breath. Negative for cough, hemoptysis, wheezing and stridor.   Cardiovascular: Positive for chest pain. Negative for palpitations, orthopnea and leg swelling.  Gastrointestinal: Negative for abdominal pain, blood in stool, diarrhea, melena, nausea and vomiting.  Genitourinary: Negative for dysuria, flank pain and hematuria.  Musculoskeletal: Negative  for back pain and joint pain.  Neurological: Positive for weakness. Negative for dizziness, sensory change, focal weakness, seizures, loss of consciousness and headaches.  Endo/Heme/Allergies: Negative for polydipsia.  Psychiatric/Behavioral: Negative for depression. The patient is not nervous/anxious.     MEDICATIONS AT HOME:   Prior to Admission medications   Medication Sig Start Date End Date Taking? Authorizing Provider  aspirin 81 MG tablet Take 81 mg by mouth daily.     Yes [provider]  levothyroxine (SYNTHROID, LEVOTHROID) 125 MCG tablet TAKE 1 TABLET BY MOUTH ONCE DAILY BEFOREBREAKFAST 02/23/17  Yes Venia Carbon, MD  omeprazole (PRILOSEC) 20 MG capsule Take 1 capsule (20 mg  total) by mouth daily. 09/17/16  Yes Viviana Simpler I, MD  polyethylene glycol powder (GLYCOLAX/MIRALAX) powder TAKE 17GM (LINE INSIDE CAP) IN EIGHT OUNCES OF WATER OR OTHER LIQUID DAILY 11/27/16  Yes Venia Carbon, MD  Probiotic Product (PROBIOTIC PO) Take 1 tablet by mouth daily.   Yes [provider]  acetaminophen (TYLENOL) 500 MG tablet Take 500 mg by mouth every 6 (six) hours as needed.    [provider]  LORazepam (ATIVAN) 0.5 MG tablet TAKE 1/2-1 TABLET BY MOUTH AT BEDTIME ASNEEDED FOR ANXIETY 03/31/17   Venia Carbon, MD  nitroGLYCERIN (NITROSTAT) 0.4 MG SL tablet Place 1 tablet (0.4 mg total) under the tongue every 5 (five) minutes as needed for chest pain. 01/09/15   Venia Carbon, MD  traMADol (ULTRAM) 50 MG tablet TAKE 1 TABLET BY MOUTH 3 TIMES DAILY AS NEEDED 03/31/17   Venia Carbon, MD      VITAL SIGNS:  Blood pressure (!) 88/74, pulse 89, temperature 98.2 F (36.8 C), temperature source Axillary, resp. rate (!) 27, height 5\' 3"  (1.6 m), weight 156 lb (70.8 kg), SpO2 97 %.  PHYSICAL EXAMINATION:  Physical Exam  Constitutional: She is oriented to person, place, and time and well-developed, well-nourished, and in no distress.  HENT:  Head: Normocephalic.  Mouth/Throat: Oropharynx is clear and moist.  Eyes: Conjunctivae and EOM are normal. Pupils are equal, round, and reactive to light. No scleral icterus.  Neck: Normal range of motion. Neck supple. No JVD present. No tracheal deviation present.  Cardiovascular: Normal rate, regular rhythm and normal heart sounds. Exam reveals no gallop.  No murmur heard. Pulmonary/Chest: Effort normal. No respiratory distress. She has no wheezes. She has no rales.  Bilateral crackles.  Abdominal: Soft. Bowel sounds are normal. She exhibits no distension. There is no tenderness. There is no rebound.  Musculoskeletal: Normal range of motion. She exhibits no edema or tenderness.  Neurological: She is alert and  oriented to person, place, and time. No cranial nerve deficit.  Skin: No rash noted. No erythema.  Psychiatric: Affect normal.    LABORATORY PANEL:   CBC Recent Labs  Lab 04/30/2017 0815  WBC 16.3*  HGB 12.6  HCT 37.7  PLT 210   ------------------------------------------------------------------------------------------------------------------  Chemistries  Recent Labs  Lab 04/26/2017 0815  NA 133*  K 4.1  CL 98*  CO2 23  GLUCOSE 115*  BUN 15  CREATININE 0.86  CALCIUM 8.0*  AST 25  ALT 11*  ALKPHOS 93  BILITOT 0.9   ------------------------------------------------------------------------------------------------------------------  Cardiac Enzymes Recent Labs  Lab 05/12/2017 0815  TROPONINI 0.06*   ------------------------------------------------------------------------------------------------------------------  RADIOLOGY:  Dg Chest Port 1 View  Result Date: 04/18/2017 CLINICAL DATA:  Chest pain, shortness of breath and pulmonary fibrosis. EXAM: PORTABLE CHEST 1 VIEW COMPARISON:  04/07/2017 FINDINGS: The heart size  and mediastinal contours are within normal limits. Superimposed on severe pulmonary fibrosis are increasing airspace opacities, primarily in both upper lobes but also potentially in the lower lobes. Findings are likely consistent with component of acute pneumonia. No associated pleural effusions. No pneumothorax. The visualized skeletal structures are unremarkable. IMPRESSION: Probable acute pneumonia superimposed on pulmonary fibrosis within both upper lobes but also potentially in the lower lobes bilaterally. Electronically Signed   By: Aletta Edouard M.D.   On: 05/18/2017 09:06      IMPRESSION AND PLAN:   Acute on chronic respiratory failure with hypoxia due to PNA and Pulmonary fibrosis. The patient will be admitted to stepdown unit. Continue BiPAP, NEB, antibiotics and intensivist consult.  Sepsis (hypotension, tachypnea, leukocytosis) due to  pneumonia, HAP The patient was treated with Zosyn and vancomycin in the ED. Start cefepime continue vancomycin, follow-up cultures.  Septic shock.  Due to above. Continue normal saline IV bolus, Levophed as needed.  Lactic acidosis.  Follow-up lactic acid level.  Treatment as above. Hyponatremia.  Continue normal saline, follow-up Na level.  Elevated troponin, due to demanding ischemia as above.  Follow-up troponin level, continue aspirin.  Chronic chest pain.  Hold tramadol at this time due to hypotension. PVD.  Continue aspirin.  Discussed with intensivist, Dr Humphrey Rolls All the records are reviewed and case discussed with ED provider. Management plans discussed with the patient, her niece (POA) and they are in agreement.  CODE STATUS: DNR  TOTAL Critical TIME TAKING CARE OF THIS PATIENT: 62 minutes.    Demetrios Loll M.D on 05/16/2017 at 9:52 AM  Between 7am to 6pm - Pager - [phone]  After 6pm go to [email or web address] - Proofreader  [facility] [place] [facility]  Office  [phone]  CC: Primary care physician; Venia Carbon, MD   Note: This dictation was prepared with Dragon dictation along with smaller phrase technology. Any transcriptional errors that result from this process are unin

## 2017-04-19 NOTE — Clinical Social Work Note (Signed)
CSW received consult that patient admitted from [place]. CSW will assess when able to discuss elevation of care to either ALF or SNF depending on her current/acute needs and PT recommendation.  Santiago Bumpers, MSW, Latanya Presser [phone]

## 2017-04-19 NOTE — ED Notes (Signed)
Pt BP dropping.  EDP aware.  See new orders.

## 2017-04-19 NOTE — Consult Note (Signed)
ANTICOAGULATION CONSULT NOTE - Initial Consult  Pharmacy Consult for heparin drip Indication: chest pain/ACS  No Known Allergies  Patient Measurements: Height: 5\' 3"  (160 cm) Weight: 159 lb 6.3 oz (72.3 kg) IBW/kg (Calculated) : 52.4 Heparin Dosing Weight: 67.5kg  Vital Signs: Temp: 98.1 F (36.7 C) (12/02 1311) Temp Source: Oral (12/02 1311) BP: 91/60 (12/02 1303) Pulse Rate: 77 (12/02 1303)  Labs: Recent Labs    04/29/2017 0815 04/28/2017 1059  HGB 12.6  --   HCT 37.7  --   PLT 210  --   LABPROT 14.4  --   INR 1.13  --   CREATININE 0.86  --   TROPONINI 0.06* 0.12*    Estimated Creatinine Clearance: 39 mL/min (by C-G formula based on SCr of 0.86 mg/dL).   Medical History: Past Medical History:  Diagnosis Date  . Aortic atherosclerosis ([place])   . Bladder cancer [facility])    Dr Bernardo Heater  . Diverticulosis of colon 1996   on sig  . Hyperlipidemia   . Legal blindness   . Macular degeneration    legally blind  . Osteoporosis   . Pain    chronic pain left chest x years. has subclavian block left arm. has never used ntg  . Pulmonary fibrosis, unspecified ([place])   . PVD (peripheral vascular disease) ([place])   . 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 Unspecified hypothyroidism     Medications:  Scheduled:  . aspirin  325 mg Oral Daily  . atorvastatin  80 mg Oral q1800  . chlorhexidine  15 mL Mouth Rinse BID  . levothyroxine  125 mcg Oral QAC breakfast  . mouth rinse  15 mL Mouth Rinse q12n4p  . pantoprazole  40 mg Oral Daily  . sodium chloride flush  3 mL Intravenous Q12H    Assessment: Pt is a 83 year old female admitted w/ chest pain,PNA. She has PMH of chronic chest pain, chronic respiratory failure, PVD, pulomary fibrosis, bladder cancer. Patient tropoinin is elevated at 0.12. Pharmacy consulted to dose heparin drip  Goal of Therapy:  Heparin level 0.3-0.7 units/ml Monitor platelets by anticoagulation protocol: Yes   Plan:   Give 4000 units bolus x 1 Start heparin infusion at 800 units/hr Check anti-Xa level in 8 hours and daily while on heparin Continue to monitor H&H and platelets  Citlali Gautney D Lynae Pederson, Pharm.D, BCPS Clinical Pharmacist 04/20/2017,2:20 PM

## 2017-04-19 NOTE — ED Notes (Signed)
Hospitalist to bedside at this time 

## 2017-04-19 NOTE — Progress Notes (Addendum)
Pt transferred to unit from ED at 1015. She is alert and oriented with complaints of chest pain. Currently on bipap with stable VS. Family is at bedside with pt. MD notified of chest pain. States he will see pt shortly. Pt's most recent troponin is elevated at 0.12. MD is aware.

## 2017-04-19 NOTE — Progress Notes (Signed)
[facility] consult with patient. Patient had medical team members in the room. CH provided prayer outside the room.

## 2017-04-19 NOTE — Progress Notes (Signed)
RT assisted with bipap patient transport from ER 8 to ICU 1 with no complications.

## 2017-04-19 NOTE — ED Provider Notes (Addendum)
[facility] Emergency Department Provider Note  ____________________________________________   I have reviewed the triage vital signs and the nursing notes.   HISTORY  Chief Complaint Chest Pain    HPI Tracy Matthews is a 83 y.o. female resents today complaining of chronic chest pain which has been there for years apparently but worse over the last several weeks.  She states it has been there constantly now for 2 weeks.  He was admitted to the hospital for this.  Patient is on oxygen she does have pulmonary fibrosis, normally she states she is on 2 L.  History is somewhat limited as patient is in acute distress with low sats and low blood pressure.  She states that she began to get short of breath over the last "little while" Pain itself is substernal, feels like reflux disease but persistent, did get nitro from EMS which did not seem to help very much.  Did have a CT scan of her chest and workup for this in the last few weeks, and the pain has persisted.  This is the same pain that brought her in in late November.  Nothing makes it better nothing makes it worse, no radiation, she cannot give many other details about her respiratory issues.  Patient does state however that she absolutely does not wish to be intubated.  She understands that even if she could die without intubation she would prefer not to have it done.   Past Medical History:  Diagnosis Date  . Aortic atherosclerosis ([place])   . Bladder cancer [facility])    Dr Bernardo Heater  . Diverticulosis of colon 1996   on sig  . Hyperlipidemia   . Legal blindness   . Macular degeneration    legally blind  . Osteoporosis   . Pain    chronic pain left chest x years. has subclavian block left arm. has never used ntg  . Pulmonary fibrosis, unspecified ([place])   . PVD (peripheral vascular disease) ([place])   . 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 Unspecified hypothyroidism     Patient  Active Problem List   Diagnosis Date Noted  . Chronic hypoxemic respiratory failure ([place]) 04/14/2017  . Pulmonary fibrosis, unspecified ([place])   . Chest pain 04/07/2017  . Mood disorder ([place]) 02/25/2017  . Preventative health care 02/05/2016  . Advance directive discussed with patient 02/05/2016  . Aortic atherosclerosis ([place])   . Constipation 08/28/2014  . Back pain 08/21/2014  . Osteoarthritis of both knees 01/03/2014  . Legal blindness   . Hypothyroidism 06/29/2007  . DIVERTICULOSIS, COLON 06/29/2007  . OSTEOPOROSIS 06/29/2007    Past Surgical History:  Procedure Laterality Date  . BLADDER SURGERY  [phone]   bladder cancer removed- [facility], then [facility]  . CATARACT EXTRACTION    . KYPHOPLASTY N/A 09/18/2016   Procedure: [id number];  Surgeon: Hessie Knows, MD;  Location: [facility] ORS;  Service: Orthopedics;  Laterality: N/A;  . TONSILLECTOMY      Prior to Admission medications   Medication Sig Start Date End Date Taking? Authorizing Provider  acetaminophen (TYLENOL) 500 MG tablet Take 500 mg by mouth every 6 (six) hours as needed.    [provider]  aspirin 81 MG tablet Take 81 mg by mouth daily.      [provider]  levothyroxine (SYNTHROID, LEVOTHROID) 125 MCG tablet TAKE 1 TABLET BY MOUTH ONCE DAILY BEFOREBREAKFAST 02/23/17   Venia Carbon, MD  LORazepam (ATIVAN) 0.5 MG  tablet TAKE 1/2-1 TABLET BY MOUTH AT BEDTIME ASNEEDED FOR ANXIETY 03/31/17   Venia Carbon, MD  nitroGLYCERIN (NITROSTAT) 0.4 MG SL tablet Place 1 tablet (0.4 mg total) under the tongue every 5 (five) minutes as needed for chest pain. 01/09/15   Venia Carbon, MD  omeprazole (PRILOSEC) 20 MG capsule Take 1 capsule (20 mg total) by mouth daily. 09/17/16   Venia Carbon, MD  polyethylene glycol powder (GLYCOLAX/MIRALAX) powder TAKE 17GM (LINE INSIDE CAP) IN EIGHT OUNCES OF WATER OR OTHER LIQUID DAILY 11/27/16   Venia Carbon, MD  Probiotic Product (PROBIOTIC PO) Take 1  tablet by mouth daily.    [provider]  traMADol (ULTRAM) 50 MG tablet TAKE 1 TABLET BY MOUTH 3 TIMES DAILY AS NEEDED 03/31/17   Venia Carbon, MD    Allergies Patient has no known allergies.  Family History  Problem Relation Age of Onset  . Other Mother        enlarged heart  . Diabetes Unknown        strong in family  . Heart attack Brother 29  . Diabetes Brother   . Cancer Brother        bladder ca  . Hypertension Neg Hx   . Breast cancer Neg Hx   . Colon cancer Neg Hx     Social History Social History   Tobacco Use  . Smoking status: Former Smoker    Last attempt to quit: 09/08/1948    Years since quitting: 68.6  . Smokeless tobacco: Never Used  Substance Use Topics  . Alcohol use: No    Alcohol/week: 0.0 oz  . Drug use: No    Review of Systems Constitutional: No fever/chills Eyes: No visual changes. ENT: No sore throat. No stiff neck no neck pain Cardiovascular: + chest pain. Respiratory: Denies shortness of breath. Gastrointestinal:   no vomiting.  No diarrhea.  No constipation. Genitourinary: Negative for dysuria. Musculoskeletal: Negative lower extremity swelling Skin: Negative for rash. Neurological: Negative for severe headaches, focal weakness or numbness.   ____________________________________________   PHYSICAL EXAM:  VITAL SIGNS: ED Triage Vitals  Enc Vitals Group     BP      Pulse      Resp      Temp      Temp src      SpO2      Weight      Height      Head Circumference      Peak Flow      Pain Score      Pain Loc      Pain Edu?      Excl. in [place]?     Constitutional: Alert and oriented.  Ill-appearing working hard to breathe Eyes: Conjunctivae are normal Head: Atraumatic HEENT: No congestion/rhinnorhea. Mucous membranes are moist.  Oropharynx non-erythematous Neck:   Nontender with no meningismus, no masses, no stridor Cardiovascular: Normal rate, regular rhythm. Grossly normal heart sounds.  Good peripheral  circulation. Respiratory: Increased respiratory effort and rate, lungs significantly diminished with diffuse occasional rhonchi appreciated. Abdominal: Soft and nontender. No distention. No guarding no rebound Back:  There is no focal tenderness or step off.  there is no midline tenderness there are no lesions noted. there is no CVA tenderness Musculoskeletal: No lower extremity tenderness, no upper extremity tenderness. No joint effusions, no DVT signs strong distal pulses no edema Neurologic:  Normal speech and language. No gross focal neurologic deficits are appreciated.  Skin:  Skin is warm, dry and intact. No rash noted. Psychiatric: Mood and affect are normal. Speech and behavior are normal.  ____________________________________________   LABS (all labs ordered are listed, but only abnormal results are displayed)  Labs Reviewed  CULTURE, BLOOD (ROUTINE X 2)  CULTURE, BLOOD (ROUTINE X 2)  URINE CULTURE  LACTIC ACID, PLASMA  LACTIC ACID, PLASMA  CBC WITH DIFFERENTIAL/PLATELET  BLOOD GAS, VENOUS  COMPREHENSIVE METABOLIC PANEL  BRAIN NATRIURETIC PEPTIDE  TROPONIN I  PROTIME-INR  URINALYSIS, COMPLETE (UACMP) WITH MICROSCOPIC  CBG MONITORING, ED    Pertinent labs  results that were available during my care of the patient were reviewed by me and considered in my medical decision making (see chart for details). ____________________________________________  EKG  I personally interpreted any EKGs ordered by me or triage Sinus rhythm, rate 96 bpm, borderline LAD noted, no acute ST elevation or depression, possible old anterior infarct ____________________________________________  RADIOLOGY  Pertinent labs & imaging results that were available during my care of the patient were reviewed by me and considered in my medical decision making (see chart for details). If possible, patient and/or family made aware of any abnormal findings.  No results  found. ____________________________________________    PROCEDURES  Procedure(s) performed: None  Procedures  Critical Care performed: CRITICAL CARE Performed by: Schuyler Amor   Total critical care time: 45 minutes  Critical care time was exclusive of separately billable procedures and treating other patients.  Critical care was necessary to treat or prevent imminent or life-threatening deterioration.  Critical care was time spent personally by me on the following activities: development of treatment plan with patient and/or surrogate as well as nursing, discussions with consultants, evaluation of patient's response to treatment, examination of patient, obtaining history from patient or surrogate, ordering and performing treatments and interventions, ordering and review of laboratory studies, ordering and review of radiographic studies, pulse oximetry and re-evaluation of patient's condition.   ____________________________________________   INITIAL IMPRESSION / ASSESSMENT AND PLAN / ED COURSE  Pertinent labs & imaging results that were available during my care of the patient were reviewed by me and considered in my medical decision making (see chart for details).  Patient here with shortness of breath and hypotension.  She has had this chest pain as well chronically and more significantly over the last several weeks.  She was recently admitted to the hospital where she had a negative PET scan for PE however she was noted to have pulmonary fibrosis and diffuse nonspecific urinary changes.  She had an echocardiogram at that time as well on the 21st of this last month which showed a small grade 1 diastolic dysfunction but otherwise EF of 60-65%  Unclear exactly what is going on today.  Blood pressure was reading in this 70s initially.  Pulse ox in the 70s.  I and easily began fluids and patient did agree to BiPAP.  Also placed on nonrebreather which brought her sats up in the mid 90s.   On fluids her pressures are in the 90s at this time.  Differential includes sepsis, CHF, ACS, dissection, PE.  However, the patient's persistent pain despite CT scan negative evidence of PE on last CT scan for the same pain, normal aortic root on recent Doppler when she had this exact same pain all limits my index of suspicion for these entities.  Patient's pressure is responding to fluids.  She does have some mild CHF although she is not on any diuretics.  We will give her  IV fluids in a controlled manner as patient states she does not wish to be intubated.  This time we have no data and we are continuing to assess.  Patient is tolerating BiPAP well.  ----------------------------------------- 8:14 AM on 05/16/2017 -----------------------------------------  Discussed with Ms. Forrest, patient's power of attorney, who does state the patient has a DNR although that was not brought in.  They confirm that she would not want to be intubated.  We will continue to do our best for this patient given these constraints  ----------------------------------------- 9:53 AM on 05/02/2017 -----------------------------------------  It is much better on BiPAP much more comfortable chest pain is nearly gone, blood work is reassuring white count however is elevated, given that I cannot intubate the patient again I am trying not to overwhelm her with IV fluid however, she does have persistently marginal blood pressures and we are escalating fluids as we go on.  Lactic is borderline elevated.       ____________________________________________   FINAL CLINICAL IMPRESSION(S) / ED DIAGNOSES  Final diagnoses:  SOB (shortness of breath)      This chart was dictated using voice recognition software.  Despite best efforts to proofread,  errors can occur which can change meaning.      Schuyler Amor, MD 05/08/2017 [id number]    Schuyler Amor, MD 05/05/2017 0086    Schuyler Amor, MD 05/15/2017 [id number]

## 2017-04-19 NOTE — ED Notes (Signed)
Attempted to receive urine specimen via bedpan, pt unable to go at this time.  EDP aware of delay.

## 2017-04-19 NOTE — Consult Note (Signed)
Pharmacy Antibiotic Note  Tracy Matthews is a 83 y.o. female admitted on 05/16/2017 with pneumonia.  Pharmacy has been consulted for cefepime and vancomycin dosing.  Plan: Vancomycin 1g was given in ED. Will give next dose in 9 hours for stacked dosing Vancomycin 750mg  IV every 24 hours.  Goal trough 15-20 mcg/mL. Trough prior to the 5th dose Cefepime 2g q 12 hr Follow up MRSA PCR- recommend d/c vanc if neg Follow up PCT  Height: 5\' 3"  (160 cm) Weight: 159 lb 6.3 oz (72.3 kg) IBW/kg (Calculated) : 52.4  Temp (24hrs), Avg:98.3 F (36.8 C), Min:98.2 F (36.8 C), Max:98.3 F (36.8 C)  Recent Labs  Lab 05/12/2017 0815  WBC 16.3*  CREATININE 0.86  LATICACIDVEN 2.3*    Estimated Creatinine Clearance: 39 mL/min (by C-G formula based on SCr of 0.86 mg/dL).    No Known Allergies  Antimicrobials this admission: zosyn 12/2 >> one dose cefepime 12/2 >>  vanc 12/2>>   Dose adjustments this admission:   Microbiology results: 12/2 BCx:  12/2 MRSA PCR: pending 11/20 MRSA PCR: neg 12/2 PCT:  Thank you for allowing pharmacy to be a part of this patient's care.  Ramond Dial 04/27/2017 10:40 AM

## 2017-04-19 NOTE — Progress Notes (Addendum)
Pt requesting to take off bipap. RT called and stated he will come to the room to trial pt on high flow [place].  Update 1830: Pt tolerating high flow nasal cannula well. She is receiving 35lpm and current SPO2 99% and RR 28/min.

## 2017-04-19 NOTE — Progress Notes (Signed)
CODE SEPSIS - PHARMACY COMMUNICATION  **Broad Spectrum Antibiotics should be administered within 1 hour of Sepsis diagnosis**  Time Code Sepsis Called/Page Received: 1005  Antibiotics Ordered: zosyn/vanc  Time of 1st antibiotic administration: 0900  Additional action taken by pharmacy: none  If necessary, Name of Provider/Nurse Contacted: none  Jancarlos Thrun D Daykin, Pharm.D, BCPS Clinical Pharmacist  04/25/2017  10:06 AM

## 2017-04-19 NOTE — ED Triage Notes (Signed)
Pt in via [facility] from [facility]; pt with complaints of chest pain x 2 weeks, being seen here recently for same, work up unremarkable.  Pt on 4L nasal cannula upon arrival, on 2L at baseline.  Pt hypoxic upon arrival in the 70's, tachypneic, hypotensive.  EDP to bedside.

## 2017-04-19 NOTE — Consult Note (Addendum)
[facility] Consultation      Name: ASLYNN BRUNETTI MRN: [id number] DOB: 1922/02/24    ADMISSION DATE:  05/15/2017 CONSULTATION DATE:  04/18/2017  REFERRING MD :  Dr. Bridgett Larsson   CHIEF COMPLAINT:   Chest pain, shortness of breath.   HISTORY OF PRESENT ILLNESS   83 years old lady with a past medical history significant for chronic hypoxemic respiratory failure-on 2 L of oxygen, bony fibrosis, dyslipidemia, peripheral vascular disease, history of chest pain who was recently discharged from the hospital in November after an inpatient stay for chest pain.  At that time workup including echocardiogram was performed and she was seen by cardiology.  Echocardiogram performed on 11/18 showed ejection fraction of 60-65% with near cavity obliteration in systole.  Cardiology recommended outpatient stress test.  History is obtained from the patient and her attendant in the room.  Per attendings, the patient had not been feeling well for the past few days but since yesterday she started having significant chest pain.  She was brought to the emergency room where she was found to be hypoxemic requiring initiation of BiPAP.  She was also hypotensive initially with systolic blood pressure in the 70s.  The patient was given 1 L of fluid with improvement in her blood pressure. The patient was transferred to ICU/stepdown unit for further care.  The patient was found to have elevated troponins with EKG showing poor progression of R waves in lead V3. She was also found to have leukocytosis. Patient initially had lactic acidosis that improved and normalized with time. Initial pro-calcitonin was negative Chest x-ray showed chronic interstitial changes with some patchy opacities.  The patient was given vancomycin and Zosyn as well as IV fluids in the ER.  In the ICU the patient denies chest pain, chest pressure.  She feels that her shortness of breath is better. She denies nausea and  vomiting. The patient systolic blood pressure has remained low in the low 90s but her mean arterial pressures have remained above 65.     PAST MEDICAL HISTORY    :  Past Medical History:  Diagnosis Date  . Aortic atherosclerosis ([place])   . Bladder cancer [facility])    Dr Bernardo Heater  . Diverticulosis of colon 1996   on sig  . Hyperlipidemia   . Legal blindness   . Macular degeneration    legally blind  . Osteoporosis   . Pain    chronic pain left chest x years. has subclavian block left arm. has never used ntg  . Pulmonary fibrosis, unspecified ([place])   . PVD (peripheral vascular disease) ([place])   . 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 Unspecified hypothyroidism    Past Surgical History:  Procedure Laterality Date  . BLADDER SURGERY  [phone]   bladder cancer removed- [facility], then [facility]  . CATARACT EXTRACTION    . KYPHOPLASTY N/A 09/18/2016   Procedure: [id number];  Surgeon: Hessie Knows, MD;  Location: [facility] ORS;  Service: Orthopedics;  Laterality: N/A;  . TONSILLECTOMY     Prior to Admission medications   Medication Sig Start Date End Date Taking? Authorizing Provider  aspirin 81 MG tablet Take 81 mg by mouth daily.     Yes [provider]  levothyroxine (SYNTHROID, LEVOTHROID) 125 MCG tablet TAKE 1 TABLET BY MOUTH ONCE DAILY BEFOREBREAKFAST 02/23/17  Yes Venia Carbon, MD  omeprazole (PRILOSEC) 20 MG capsule Take 1 capsule (20 mg total) by mouth daily. 09/17/16  Yes Viviana Simpler I, MD  polyethylene glycol powder (GLYCOLAX/MIRALAX) powder TAKE 17GM (LINE INSIDE CAP) IN EIGHT OUNCES OF WATER OR OTHER LIQUID DAILY 11/27/16  Yes Venia Carbon, MD  Probiotic Product (PROBIOTIC PO) Take 1 tablet by mouth daily.   Yes [provider]  acetaminophen (TYLENOL) 500 MG tablet Take 500 mg by mouth every 6 (six) hours as needed.    [provider]  LORazepam (ATIVAN) 0.5 MG tablet TAKE 1/2-1 TABLET BY MOUTH AT  BEDTIME ASNEEDED FOR ANXIETY 03/31/17   Venia Carbon, MD  nitroGLYCERIN (NITROSTAT) 0.4 MG SL tablet Place 1 tablet (0.4 mg total) under the tongue every 5 (five) minutes as needed for chest pain. 01/09/15   Venia Carbon, MD  traMADol (ULTRAM) 50 MG tablet TAKE 1 TABLET BY MOUTH 3 TIMES DAILY AS NEEDED 03/31/17   Venia Carbon, MD   No Known Allergies   FAMILY HISTORY   Family History  Problem Relation Age of Onset  . Other Mother        enlarged heart  . Diabetes Unknown        strong in family  . Heart attack Brother 60  . Diabetes Brother   . Cancer Brother        bladder ca  . Hypertension Neg Hx   . Breast cancer Neg Hx   . Colon cancer Neg Hx       SOCIAL HISTORY    reports that she quit smoking about 68 years ago. she has never used smokeless tobacco. She reports that she does not drink alcohol or use drugs.  ROS Negative except for that mentioned in HPI gative except for that mentioned in HPI  VITAL SIGNS    Temp:  [98.1 F (36.7 C)-99.2 F (37.3 C)] 99.2 F (37.3 C) (12/02 1556) Pulse Rate:  [77-97] 81 (12/02 1700) Resp:  [24-40] 27 (12/02 1700) BP: (70-98)/(50-74) 85/59 (12/02 1700) SpO2:  [75 %-98 %] 97 % (12/02 1733) FiO2 (%):  [35 %-100 %] 100 % (12/02 1733) Weight:  [156 lb (70.8 kg)-159 lb 6.3 oz (72.3 kg)] 159 lb 6.3 oz (72.3 kg) (12/02 1025) HEMODYNAMICS:   VENTILATOR SETTINGS: FiO2 (%):  [35 %-100 %] 100 % INTAKE / OUTPUT:  Intake/Output Summary (Last 24 hours) at 05/08/2017 1825 Last data filed at 05/15/2017 1718 Gross per 24 hour  Intake 1300 ml  Output 50 ml  Net 1250 ml       PHYSICAL EXAM   Physical Exam  Awake, not in distress. Tolerating BiPAP. CVS S1, S2, 0. Chest examination has occasional bilateral crackles.  No rales rhonchi or wheezes. Abdomen is soft, nontender.  Benign. No lower extremity edema bilaterally    LABS   LABS:  CBC Recent Labs  Lab 05/02/2017 0815  WBC 16.3*  HGB 12.6  HCT 37.7   PLT 210   Coag's Recent Labs  Lab 04/30/2017 0815  APTT 34  INR 1.13   BMET Recent Labs  Lab 04/23/2017 0815  NA 133*  K 4.1  CL 98*  CO2 23  BUN 15  CREATININE 0.86  GLUCOSE 115*   Electrolytes Recent Labs  Lab 05/06/2017 0815  CALCIUM 8.0*   Sepsis Markers Recent Labs  Lab 05/17/2017 0815 05/09/2017 1059 04/20/2017 1144  LATICACIDVEN 2.3*  --  1.2  PROCALCITON  --  0.21  --    ABG No results for input(s): PHART, PCO2ART, PO2ART in the last 168 hours. Liver Enzymes Recent Labs  Lab 05/04/2017  0815  AST 25  ALT 11*  ALKPHOS 93  BILITOT 0.9  ALBUMIN 2.5*   Cardiac Enzymes Recent Labs  Lab 05/02/2017 0815 04/28/2017 1059  TROPONINI 0.39*  0.06* 0.12*   Glucose Recent Labs  Lab 05/17/2017 0908  GLUCAP 94     Recent Results (from the past 240 hour(s))  MRSA PCR Screening     Status: None   Collection Time: 04/29/2017 10:24 AM  Result Value Ref Range Status   MRSA by PCR NEGATIVE NEGATIVE Final    Comment:        The GeneXpert MRSA Assay (FDA approved for NASAL specimens only), is one component of a comprehensive MRSA colonization surveillance program. It is not intended to diagnose MRSA infection nor to guide or monitor treatment for MRSA infections.      Current Facility-Administered Medications:  .  0.9 %  sodium chloride infusion, 250 mL, Intravenous, PRN, Demetrios Loll, MD .  0.9 %  sodium chloride infusion, , Intravenous, Continuous, Nettie Elm, MD, Last Rate: 75 mL/hr at 04/26/2017 1354 .  acetaminophen (TYLENOL) tablet 650 mg, 650 mg, Oral, Q6H PRN **OR** acetaminophen (TYLENOL) suppository 650 mg, 650 mg, Rectal, Q6H PRN, Demetrios Loll, MD .  albuterol (PROVENTIL) (2.5 MG/3ML) 0.083% nebulizer solution 2.5 mg, 2.5 mg, Nebulization, Q2H PRN, Demetrios Loll, MD .  aspirin suppository 300 mg, 300 mg, Rectal, Daily, Jonathon Bellows, MD, 300 mg at 05/10/2017 1550 .  atorvastatin (LIPITOR) tablet 80 mg, 80 mg, Oral, q1800, Nettie Elm, MD .  bisacodyl (DULCOLAX)  EC tablet 5 mg, 5 mg, Oral, Daily PRN, Demetrios Loll, MD .  ceFEPIme (MAXIPIME) 2 g in dextrose 5 % 50 mL IVPB, 2 g, Intravenous, Q12H, Ramond Dial, RPH, Stopped at 04/29/2017 1304 .  chlorhexidine (PERIDEX) 0.12 % solution 15 mL, 15 mL, Mouth Rinse, BID, Nettie Elm, MD .  fentaNYL (SUBLIMAZE) injection 25-50 mcg, 25-50 mcg, Intravenous, Q2H PRN, Jonathon Bellows, MD, 50 mcg at 04/29/2017 1751 .  heparin ADULT infusion 100 units/mL (25000 units/273mL sodium chloride 0.45%), 800 Units/hr, Intravenous, Continuous, Maccia, Melissa D, RPH, Last Rate: 8 mL/hr at 05/08/2017 1350, 800 Units/hr at 05/06/2017 1350 .  levothyroxine (SYNTHROID, LEVOTHROID) tablet 125 mcg, 125 mcg, Oral, QAC breakfast, Demetrios Loll, MD, Stopped at 05/04/2017 1159 .  LORazepam (ATIVAN) tablet 0.5 mg, 0.5 mg, Oral, QHS PRN, Demetrios Loll, MD .  MEDLINE mouth rinse, 15 mL, Mouth Rinse, q12n4p, Nettie Elm, MD .  nitroGLYCERIN (NITROSTAT) SL tablet 0.4 mg, 0.4 mg, Sublingual, Q5 min PRN, Demetrios Loll, MD .  ondansetron [facility]) tablet 4 mg, 4 mg, Oral, Q6H PRN **OR** ondansetron (ZOFRAN) injection 4 mg, 4 mg, Intravenous, Q6H PRN, Demetrios Loll, MD .  pantoprazole (PROTONIX) EC tablet 40 mg, 40 mg, Oral, Daily, Demetrios Loll, MD, Stopped at 04/22/2017 1159 .  senna-docusate (Senokot-S) tablet 1 tablet, 1 tablet, Oral, QHS PRN, Demetrios Loll, MD .  sodium chloride flush (NS) 0.9 % injection 3 mL, 3 mL, Intravenous, Q12H, Demetrios Loll, MD, 3 mL at 05/14/2017 1030 .  sodium chloride flush (NS) 0.9 % injection 3 mL, 3 mL, Intravenous, PRN, Demetrios Loll, MD .  traMADol Veatrice Bourbon) tablet 50 mg, 50 mg, Oral, TID PRN, Demetrios Loll, MD, 50 mg at 04/26/2017 1416 .  vancomycin (VANCOCIN) IVPB 750 mg/150 ml premix, 750 mg, Intravenous, Q24H, Maccia, Carollee Massed, RPH  IMAGING    Dg Chest Port 1 View  Result Date: 04/26/2017 CLINICAL DATA:  Chest pain, shortness of breath and pulmonary fibrosis. EXAM: PORTABLE CHEST  1 VIEW COMPARISON:  04/07/2017 FINDINGS: The heart size and  mediastinal contours are within normal limits. Superimposed on severe pulmonary fibrosis are increasing airspace opacities, primarily in both upper lobes but also potentially in the lower lobes. Findings are likely consistent with component of acute pneumonia. No associated pleural effusions. No pneumothorax. The visualized skeletal structures are unremarkable. IMPRESSION: Probable acute pneumonia superimposed on pulmonary fibrosis within both upper lobes but also potentially in the lower lobes bilaterally. Electronically Signed   By: Aletta Edouard M.D.   On: 05/11/2017 09:06       MICRO DATA: MRSA PCR  Urine  Blood Resp     ASSESSMENT/PLAN   83 years old lady with a past medical history significant for chronic hypoxemic respiratory failure-on 2 L of oxygen, pulmonary fibrosis, dyslipidemia, peripheral vascular disease, history of chest pain who was recently discharged from the hospital in November after an inpatient stay for chest pain.  At that time workup including echocardiogram was performed and she was seen by cardiology.  Echocardiogram performed on 11/18 showed ejection fraction of 60-65% with near cavity obliteration in systole.  Cardiology recommended outpatient stress test.  Admitted to the hospital with the following:  Acute on chronic hypoxemic respiratory failure - on 2 L O2 at home Pneumonia Sepsis Lactic acidosis Elevated troponins-?  Non-ST elevation MI Dyslipidemia Pulmonary fibrosis Peripheral vascular disease  The patient is stable in the ICU.    Her systolic blood pressures are in the low 90s to high 80s but her mean arterial pressures have been more than 65 hence the patient has not been started on vasopressors. The patient is DNR/DNI and sitting her already tenuous respiratory status, I do not want to risk her going into frank respiratory failure as we cannot intubate and mechanically ventilate the patient. Given the above, I will gently hydrate the patient.   She was given 500 mL's bolus on arrival to the ICU and was started on normal saline at 75 mL's per hour.  We will continue the IV fluids. In response to this her lactic acid has normalized.  Agree with broad-spectrum antibiotics.  Continue vancomycin and cefepime. Follow cultures. Lactic acidosis has resolved.  Troponin elevation noted.  Less likely to be secondary to sepsis.  Concerning for non-ST elevation MI. Cardiology consult called.  Discussed personally with Dr. Algernon Huxley on the phone. He recommends IV heparin and medical management for now until further evaluation by cardiology. Heparin drip.  Aspirin and high-dose statins. Given the underlying systolic blood pressures, we cannot initiate beta-blockers. Check echocardiogram. Trend troponin Further management per cardiology  Continue BiPAP.  Will consider transitioning to heated high flow if the patient tolerates. Wean FiO2 as tolerated.  Continue Synthroid.  Protonix for stress ulcer prophylaxis Systemic heparin as above  CODE STATUS DNR/DNI.  Discussed at length with the patient's caregivers in the room.   Total critical care time spent was 65 minutes.   Nettie Elm, M.D.  Pulmonary and Critical Care Medicine

## 2017-04-19 NOTE — Progress Notes (Signed)
Trial off bipap and placed on 6lpm [place], sats 93%, respiratory rate 28/min, RN notified, will continue to monitor

## 2017-04-19 NOTE — ED Notes (Signed)
RT called to bedside; Bipap initiated at this time.

## 2017-04-19 NOTE — Consult Note (Signed)
ANTICOAGULATION CONSULT NOTE - Initial Consult  Pharmacy Consult for heparin drip Indication: chest pain/ACS  No Known Allergies  Patient Measurements: Height: 5\' 3"  (160 cm) Weight: 159 lb 6.3 oz (72.3 kg) IBW/kg (Calculated) : 52.4 Heparin Dosing Weight: 67.5kg  Vital Signs: Temp: 98.8 F (37.1 C) (12/02 2033) Temp Source: Oral (12/02 2033) BP: 148/69 (12/02 2200) Pulse Rate: 94 (12/02 2200)  Labs: Recent Labs    04/20/2017 0815 05/01/2017 1059 04/24/2017 2150  HGB 12.6  --   --   HCT 37.7  --   --   PLT 210  --   --   APTT 34  --   --   LABPROT 14.4  --   --   INR 1.13  --   --   HEPARINUNFRC  --   --  0.11*  CREATININE 0.86  --   --   TROPONINI 0.39*  0.06* 0.12*  --     Estimated Creatinine Clearance: 39 mL/min (by C-G formula based on SCr of 0.86 mg/dL).   Medical History: Past Medical History:  Diagnosis Date  . Aortic atherosclerosis ([place])   . Bladder cancer [facility])    Dr Bernardo Heater  . Diverticulosis of colon 1996   on sig  . Hyperlipidemia   . Legal blindness   . Macular degeneration    legally blind  . Osteoporosis   . Pain    chronic pain left chest x years. has subclavian block left arm. has never used ntg  . Pulmonary fibrosis, unspecified ([place])   . PVD (peripheral vascular disease) ([place])   . 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 Unspecified hypothyroidism     Medications:  Scheduled:  . aspirin  300 mg Rectal Daily  . atorvastatin  80 mg Oral q1800  . chlorhexidine  15 mL Mouth Rinse BID  . heparin  2,000 Units Intravenous Once  . levothyroxine  125 mcg Oral QAC breakfast  . mouth rinse  15 mL Mouth Rinse q12n4p  . pantoprazole  40 mg Oral Daily  . sodium chloride flush  3 mL Intravenous Q12H    Assessment: Pt is a 83 year old female admitted w/ chest pain,PNA. She has PMH of chronic chest pain, chronic respiratory failure, PVD, pulomary fibrosis, bladder cancer. Patient tropoinin is elevated at 0.12.  Pharmacy consulted to dose heparin drip  Goal of Therapy:  Heparin level 0.3-0.7 units/ml Monitor platelets by anticoagulation protocol: Yes   Plan:  Give 4000 units bolus x 1 Start heparin infusion at 800 units/hr Check anti-Xa level in 8 hours and daily while on heparin Continue to monitor H&H and platelets   12/2 2200 heparin level 0.11. 2000 unit bolus and increase rate to 1050 units/hr. Recheck heparin level in 8 hours.  Sim Boast, PharmD, BCPS  04/25/2017 11:27 PM

## 2017-04-20 ENCOUNTER — Institutional Professional Consult (permissible substitution): Payer: Medicare Other | Admitting: Internal Medicine

## 2017-04-20 DIAGNOSIS — Z66 Do not resuscitate: Secondary | ICD-10-CM

## 2017-04-20 DIAGNOSIS — R0789 Other chest pain: Secondary | ICD-10-CM

## 2017-04-20 DIAGNOSIS — Z515 Encounter for palliative care: Secondary | ICD-10-CM

## 2017-04-20 DIAGNOSIS — R0602 Shortness of breath: Secondary | ICD-10-CM

## 2017-04-20 DIAGNOSIS — I248 Other forms of acute ischemic heart disease: Secondary | ICD-10-CM

## 2017-04-20 LAB — BASIC METABOLIC PANEL
ANION GAP: 12 (ref 5–15)
BUN: 14 mg/dL (ref 6–20)
CALCIUM: 8.2 mg/dL — AB (ref 8.9–10.3)
CHLORIDE: 106 mmol/L (ref 101–111)
CO2: 19 mmol/L — AB (ref 22–32)
Creatinine, Ser: 0.75 mg/dL (ref 0.44–1.00)
GFR calc non Af Amer: >60 mL/min (ref >60)
GLUCOSE: 86 mg/dL (ref 65–99)
Potassium: 4.3 mmol/L (ref 3.5–5.1)
Sodium: 137 mmol/L (ref 135–145)

## 2017-04-20 LAB — CBC
HEMATOCRIT: 35.5 % (ref 35.0–47.0)
HEMOGLOBIN: 11.5 g/dL — AB (ref 12.0–16.0)
MCH: 29.3 pg (ref 26.0–34.0)
MCHC: 32.4 g/dL (ref 32.0–36.0)
MCV: 90.3 fL (ref 80.0–100.0)
Platelets: 246 10*3/uL (ref 150–440)
RBC: 3.93 MIL/uL (ref 3.80–5.20)
RDW: 14.5 % (ref 11.5–14.5)
WBC: 20.3 10*3/uL — ABNORMAL HIGH (ref 3.6–11.0)

## 2017-04-20 LAB — HEPARIN LEVEL (UNFRACTIONATED): Heparin Unfractionated: 0.29 IU/mL — ABNORMAL LOW (ref 0.30–0.70)

## 2017-04-20 LAB — MAGNESIUM: Magnesium: 2.5 mg/dL — ABNORMAL HIGH (ref 1.7–2.4)

## 2017-04-20 LAB — PHOSPHORUS: Phosphorus: 2.9 mg/dL (ref 2.5–4.6)

## 2017-04-20 MED ORDER — FUROSEMIDE 10 MG/ML IJ SOLN: 20.0000 mg | Freq: Once | INTRAMUSCULAR | Status: DC

## 2017-04-20 MED ORDER — PANTOPRAZOLE SODIUM 40 MG IV SOLR
40.0000 mg | Freq: Two times a day (BID) | INTRAVENOUS | Status: DC
  Administered 2017-04-20 (×2): 40 mg via INTRAVENOUS
  Filled 2017-04-20 (×2): qty 40

## 2017-04-20 MED ORDER — ISOSORBIDE MONONITRATE ER 30 MG PO TB24: 30.0000 mg | ORAL_TABLET | Freq: Every day | ORAL | Status: DC

## 2017-04-20 MED ORDER — ENOXAPARIN SODIUM 40 MG/0.4ML ~~LOC~~ SOLN
40.0000 mg | SUBCUTANEOUS | Status: DC
  Administered 2017-04-20: 40 mg via SUBCUTANEOUS
  Filled 2017-04-20: qty 0.4

## 2017-04-20 MED ORDER — MORPHINE SULFATE (PF) 2 MG/ML IV SOLN
2.0000 mg | Freq: Once | INTRAVENOUS | Status: AC
  Administered 2017-04-20: 2 mg via INTRAVENOUS
  Filled 2017-04-20: qty 2

## 2017-04-20 MED ORDER — LEVOTHYROXINE SODIUM 125 MCG PO TABS
125.0000 ug | ORAL_TABLET | Freq: Every day | ORAL | Status: DC
  Filled 2017-04-20: qty 1

## 2017-04-20 MED ORDER — PANTOPRAZOLE SODIUM 40 MG PO TBEC: 40.0000 mg | DELAYED_RELEASE_TABLET | Freq: Two times a day (BID) | ORAL | Status: DC

## 2017-04-20 MED ORDER — LEVOTHYROXINE SODIUM 100 MCG IV SOLR
62.5000 ug | Freq: Every day | INTRAVENOUS | Status: DC
  Administered 2017-04-20: 62.5 ug via INTRAVENOUS
  Filled 2017-04-20 (×2): qty 5

## 2017-04-20 MED ORDER — ALUM & MAG HYDROXIDE-SIMETH 200-200-20 MG/5ML PO SUSP
15.0000 mL | ORAL | Status: DC | PRN
  Filled 2017-04-20: qty 30

## 2017-04-20 MED ORDER — MORPHINE SULFATE (PF) 2 MG/ML IV SOLN
2.0000 mg | INTRAVENOUS | Status: DC | PRN
  Administered 2017-04-21 (×2): 2 mg via INTRAVENOUS
  Filled 2017-04-20 (×2): qty 1

## 2017-04-20 MED ORDER — FENTANYL CITRATE (PF) 100 MCG/2ML IJ SOLN
50.0000 ug | INTRAMUSCULAR | Status: DC | PRN
  Administered 2017-04-20 (×4): 50 ug via INTRAVENOUS
  Filled 2017-04-20 (×4): qty 2

## 2017-04-20 MED ORDER — FUROSEMIDE 10 MG/ML IJ SOLN
INTRAMUSCULAR | Status: AC
  Filled 2017-04-20: qty 2

## 2017-04-20 NOTE — Progress Notes (Signed)
RN made Dr. Humphrey Rolls aware that blood pressure is 95/53 with MAP of 66 after giving morphine.  RN asked if lasix should still be given.  MD stated to hold lasix for now.

## 2017-04-20 NOTE — Progress Notes (Signed)
Anticoagulation monitoring(Lovenox):  83yo  female ordered Lovenox 30 mg Q24h  Filed Weights   05/17/2017 0842 05/03/2017 1025  Weight: 156 lb (70.8 kg) 159 lb 6.3 oz (72.3 kg)   BMI 28.2   Lab Results  Component Value Date   CREATININE 0.86 05/16/2017   CREATININE 1.02 (H) 04/07/2017   CREATININE 0.90 09/08/2016   Estimated Creatinine Clearance: 39 mL/min (by C-G formula based on SCr of 0.86 mg/dL). Hemoglobin & Hematocrit     Component Value Date/Time   HGB 12.6 04/27/2017 0815   HCT 37.7 04/29/2017 0815     Per Protocol for Patient with estCrcl > 30 ml/min and BMI < 40, will transition to Lovenox 40 mg Q24h.

## 2017-04-20 NOTE — Care Management (Signed)
Patient is a resident of [facility] independent [facility].  HCPOA is [place].  Currently has services of [facility] and [facility] and PT.  Notified [facility]. Relatively new home oxygen through [facility] as of 04/08/2017. She is admitted to icu stepdown due to need for continuous bipap.  Currently on high flow nasal cannula

## 2017-04-20 NOTE — Consult Note (Signed)
Consultation Note Date: 04/20/2017   Patient Name: Tracy Matthews  DOB: 02-02-1922  MRN: [id number]  Age / Sex: 83 y.o., female  PCP: Venia Carbon, MD Referring Physician: Demetrios Loll, MD  Reason for Consultation: Establishing goals of care and Psychosocial/spiritual support  HPI/Patient Profile: 83 y.o. female   admitted on 04/20/2017 with a known history of chronic chest pain, chronic respiratory failure on home oxygen 2 L, pulmonary fibrosis, PVD, hyperlipidemia, diverticulosis and bladder cancer.    The patient was sent from independent living due to above chief complaints.    She was admitted for the same symptoms less than 2 weeks ago. CTA to did not show any PE. She is on home oxygen 2 L, but was found hypotension, tachypnea and hypoxia in the ED.  She is put on BiPAP and given normal saline bolus.   Patient admitted to the ICU for stabilization and treatment.  Patient continues to decline in spite of medical interventions.  Currently she is on 100% high flow nasal cannula and remains dyspneic.  She has refused utilization of BiPAP.  Patient and family face treatment option decisions, advanced directive decisions, and anticipatory care needs.   Clinical Assessment and Goals of Care:  This NP Wadie Lessen reviewed medical records, received report from team, assessed the patient and then meet at the patient's bedside along with her niece/Silvia Tamala Julian  to discuss diagnosis, prognosis, GOC, EOL wishes disposition and options.  A  discussion was had today regarding advanced directives.  Concepts specific to code status, artifical feeding and hydration, continued IV antibiotics and rehospitalization was had.  The difference between a aggressive medical intervention path  and a palliative comfort care path for this patient at this time was had.  Values and goals of care important to patient and family  were attempted to be elicited.  Concept of Hospice and Palliative Care were discussed  Natural trajectory and expectations at EOL were discussed.  Questions and concerns addressed.   Family encouraged to call with questions or concerns.  PMT will continue to support holistically.   HCPOA/ two listed neices    SUMMARY OF RECOMMENDATIONS    This time patient and her family remain hopeful for improvement.  They are unable to make a shift to a more comfort path and wished to wait another 24 hours before making decisions.  Niece at bedside will not make any medical decisions until she speaks with her sister who is also healthcare power of attorney.  All understand the long-term poor prognosis.  Code Status/Advance Care Planning:  DNR   Symptom Management:   Discussed with patient and her niece at bedside the importance of clarification of goals of care before implementing a symptom management regimen  Palliative Prophylaxis:   Aspiration, Bowel Regimen, Delirium Protocol, Frequent Pain Assessment and Oral Care  Additional Recommendations (Limitations, Scope, Preferences):  Full Scope Treatment  Psycho-social/Spiritual:   Desire for further Chaplaincy support:no-declined  Additional Recommendations: Education on Hospice  Prognosis:   Unable to determine  Discharge  Planning: To Be Determined      Primary Diagnoses: Present on Admission: . Acute on chronic respiratory failure with hypoxia ([place])   I have reviewed the medical record, interviewed the patient and family, and examined the patient. The following aspects are pertinent.  Past Medical History:  Diagnosis Date  . Aortic atherosclerosis ([place])   . Bladder cancer [facility])    Dr Bernardo Heater  . Diverticulosis of colon 1996   on sig  . Hyperlipidemia   . Legal blindness   . Macular degeneration    legally blind  . Osteoporosis   . Pain    chronic pain left chest x years. has subclavian block left arm. has never  used ntg  . Pulmonary fibrosis, unspecified ([place])   . PVD (peripheral vascular disease) ([place])   . 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 Unspecified hypothyroidism    Social History   Socioeconomic History  . Marital status: Widowed    Spouse name: None  . Number of children: 0  . Years of education: None  . Highest education level: None  Social Needs  . Financial resource strain: None  . Food insecurity - worry: None  . Food insecurity - inability: None  . Transportation needs - medical: None  . Transportation needs - non-medical: None  Occupational History  . None  Tobacco Use  . Smoking status: Former Smoker    Last attempt to quit: 09/08/1948    Years since quitting: 68.6  . Smokeless tobacco: Never Used  Substance and Sexual Activity  . Alcohol use: No    Alcohol/week: 0.0 oz  . Drug use: No  . Sexual activity: None  Other Topics Concern  . None  Social History Narrative   Widowed 11/08   No children   Doesn't drive   Has living will. Has DNR form already- wants this continued.    Request husbands niece [facility] or [place] as health care POA   Wouldn't want feeding tube   Family History  Problem Relation Age of Onset  . Other Mother        enlarged heart  . Diabetes Unknown        strong in family  . Heart attack Brother 20  . Diabetes Brother   . Cancer Brother        bladder ca  . Hypertension Neg Hx   . Breast cancer Neg Hx   . Colon cancer Neg Hx    Scheduled Meds: . aspirin  300 mg Rectal Daily  . atorvastatin  80 mg Oral q1800  . chlorhexidine  15 mL Mouth Rinse BID  . enoxaparin (LOVENOX) injection  40 mg Subcutaneous Q24H  . furosemide      . furosemide  20 mg Intravenous Once  . isosorbide mononitrate  30 mg Oral Daily  . levothyroxine  62.5 mcg Intravenous Daily  . mouth rinse  15 mL Mouth Rinse q12n4p  . pantoprazole (PROTONIX) IV  40 mg Intravenous Q12H  . sodium chloride flush  3 mL Intravenous Q12H     Continuous Infusions: . sodium chloride    . sodium chloride 75 mL/hr at 04/20/17 0600  . ceFEPime (MAXIPIME) IV Stopped (04/20/17 1018)  . sodium chloride     PRN Meds:.sodium chloride, acetaminophen **OR** acetaminophen, albuterol, alum & mag hydroxide-simeth, bisacodyl, fentaNYL (SUBLIMAZE) injection, LORazepam, nitroGLYCERIN, ondansetron **OR** ondansetron (ZOFRAN) IV, senna-docusate, sodium chloride flush, traMADol Medications Prior to Admission:  Prior to Admission medications  Medication Sig Start Date End Date Taking? Authorizing Provider  aspirin 81 MG tablet Take 81 mg by mouth daily.     Yes [provider]  levothyroxine (SYNTHROID, LEVOTHROID) 125 MCG tablet TAKE 1 TABLET BY MOUTH ONCE DAILY BEFOREBREAKFAST 02/23/17  Yes Venia Carbon, MD  omeprazole (PRILOSEC) 20 MG capsule Take 1 capsule (20 mg total) by mouth daily. 09/17/16  Yes Viviana Simpler I, MD  polyethylene glycol powder (GLYCOLAX/MIRALAX) powder TAKE 17GM (LINE INSIDE CAP) IN EIGHT OUNCES OF WATER OR OTHER LIQUID DAILY 11/27/16  Yes Venia Carbon, MD  Probiotic Product (PROBIOTIC PO) Take 1 tablet by mouth daily.   Yes [provider]  acetaminophen (TYLENOL) 500 MG tablet Take 500 mg by mouth every 6 (six) hours as needed.    [provider]  LORazepam (ATIVAN) 0.5 MG tablet TAKE 1/2-1 TABLET BY MOUTH AT BEDTIME ASNEEDED FOR ANXIETY 03/31/17   Venia Carbon, MD  nitroGLYCERIN (NITROSTAT) 0.4 MG SL tablet Place 1 tablet (0.4 mg total) under the tongue every 5 (five) minutes as needed for chest pain. 01/09/15   Venia Carbon, MD  traMADol (ULTRAM) 50 MG tablet TAKE 1 TABLET BY MOUTH 3 TIMES DAILY AS NEEDED 03/31/17   Venia Carbon, MD   No Known Allergies Review of Systems  Respiratory: Positive for shortness of breath.   Neurological: Positive for weakness.    Physical Exam  Constitutional: She appears well-developed. She appears lethargic. She appears ill.   generalized weakness  Cardiovascular: Normal rate, regular rhythm and normal heart sounds.  Pulmonary/Chest: She has decreased breath sounds.  Neurological: She appears lethargic.  Skin: Skin is warm and dry.    Vital Signs: BP (!) 95/53   Pulse 94   Temp 98.5 F (36.9 C) (Axillary)   Resp (!) 28   Ht 5\' 3"  (1.6 m)   Wt 72.3 kg (159 lb 6.3 oz)   SpO2 94%   BMI 28.24 kg/m  Pain Assessment: 0-10 POSS *See Group Information*: 1-Acceptable,Awake and alert Pain Score: Asleep   SpO2: SpO2: 94 % O2 Device:SpO2: 94 % O2 Flow Rate: .O2 Flow Rate (L/min): 35 L/min  IO: Intake/output summary:   Intake/Output Summary (Last 24 hours) at 04/20/2017 1119 Last data filed at 04/20/2017 1000 Gross per 24 hour  Intake 1791.96 ml  Output 300 ml  Net 1491.96 ml    LBM: Last BM Date: 05/14/2017 Baseline Weight: Weight: 70.8 kg (156 lb) Most recent weight: Weight: 72.3 kg (159 lb 6.3 oz)     Palliative Assessment/Data: 30 % currently   Discussed with Dr Bridgett Larsson  Time In: 1000 Time Out: 1115 Time Total: 75 minutes Greater than 50%  of this time was spent counseling and coordinating care related to the above assessment and plan.  Signed by: Wadie Lessen, NP   Please contact Palliative Medicine Team phone at [phone] for questions and concerns.  For individual provider: See Shea Evans

## 2017-04-20 NOTE — Progress Notes (Signed)
[facility] [place] NAME: Tracy Matthews    MR#:  [id number]  DATE OF BIRTH:  Jul 26, 1921  SUBJECTIVE:  CHIEF COMPLAINT:   Chief Complaint  Patient presents with  . Chest Pain   She has SOB and cough, on HF, off BIPAP. She feels too weak to talk. REVIEW OF SYSTEMS:  Review of Systems  Unable to perform ROS: Critical illness    DRUG ALLERGIES:  No Known Allergies VITALS:  Blood pressure (!) 85/65, pulse 86, temperature 97.7 F (36.5 C), temperature source Axillary, resp. rate (!) 26, height 5\' 3"  (1.6 m), weight 159 lb 6.3 oz (72.3 kg), SpO2 94 %. PHYSICAL EXAMINATION:  Physical Exam  Constitutional:  Looks critically ill.  HENT:  Head: Normocephalic.  Eyes: Conjunctivae and EOM are normal. Pupils are equal, round, and reactive to light. No scleral icterus.  Neck: Normal range of motion. Neck supple. No JVD present. No tracheal deviation present.  Cardiovascular: Normal rate, regular rhythm and normal heart sounds. Exam reveals no gallop.  No murmur heard. Pulmonary/Chest: Effort normal. No respiratory distress. She has no wheezes. She has no rales.  Bilateral crackles.  Abdominal: Soft. Bowel sounds are normal. She exhibits no distension. There is no tenderness. There is no rebound.  Musculoskeletal: Normal range of motion. She exhibits no edema or tenderness.  Neurological: No cranial nerve deficit.  Looks critical ill.  Skin: No rash noted. No erythema.   LABORATORY PANEL:  Female CBC Recent Labs  Lab 04/20/17 0849  WBC 20.3*  HGB 11.5*  HCT 35.5  PLT 246   ------------------------------------------------------------------------------------------------------------------ Chemistries  Recent Labs  Lab 04/23/2017 0815 05/10/2017 2150 04/20/17 0849  NA 133*  --  137  K 4.1  --  4.3  CL 98*  --  106  CO2 23  --  19*  GLUCOSE 115*  --  86  BUN 15  --  14  CREATININE 0.86  --  0.75  CALCIUM 8.0*  --  8.2*  MG  --   2.5*  --   AST 25  --   --   ALT 11*  --   --   ALKPHOS 93  --   --   BILITOT 0.9  --   --    RADIOLOGY:  No results found. ASSESSMENT AND PLAN:   Acute on chronic respiratory failure with hypoxia due to PNA and Pulmonary fibrosis.  Continue HF, off BiPAP, NEB  Sepsis (hypotension, tachypnea, leukocytosis) due to pneumonia, HAP continue cefepime and discontinue vancomycin, follow-up cultures.  Septic shock.  Due to above. Improved with normal saline IV bolus..  Lactic acidosis. improved. Hyponatremia.  improved.  Elevated troponin, due to demanding ischemia as above.   continue aspirin and heparin drip.  Chronic chest pain.  Hold tramadol at this time due to hypotension. PVD.  Continue aspirin.  Discussed with intensivist, Dr Humphrey Rolls  All the records are reviewed and case discussed with Care Management/Social Worker. Management plans discussed with the patient, her niece (POA) and they are in agreement.  CODE STATUS: DNR  TOTAL TIME TAKING CARE OF THIS PATIENT: 38 minutes.   More than 50% of the time was spent in counseling/coordination of care: YES  POSSIBLE D/C IN 3 DAYS, DEPENDING ON CLINICAL CONDITION.   Demetrios Loll M.D on 04/20/2017 at 1:57 PM  Between 7am to 6pm - Pager - [phone]  After 6pm go to [email or web address] - [facility]

## 2017-04-20 NOTE — Consult Note (Signed)
Cardiology Consultation:   Patient ID: Tracy Matthews; [id number]; [birth year]   Admit date: 04/28/2017 Date of Consult: 04/20/2017  Primary Care Provider: Venia Carbon, MD Primary Cardiologist: Dr. Rockey Situ Primary Electrophysiologist:  n/a   Patient Profile:   Tracy Matthews is a 83 y.o. female with a hx of pulmonary fibrosis, peripheral arterial disease, hypothyroidism, bladder cancer and remote tobacco use who is being seen today for the evaluation of chest pain and mildly elevated troponin at the request of Dr. Humphrey Rolls.  History of Present Illness:   Tracy Matthews is a 83 year old female with reported history of mild nonobstructive coronary artery disease according to her niece, peripheral arterial disease and recent admission for respiratory failure with diagnosis of pulmonary fibrosis.  At that time she had chest pain and mildly elevated troponin was seen by our consult team.  It was felt that her symptoms were atypical overall.  Echocardiogram showed normal LV systolic function. The patient returned with increased shortness of breath, hypoxia and substernal chest pain.  She describes the chest pain as burning sensation with no radiation.  This has been continuous for a few days.  She did report that it was worse with physical activities.  She was noted to be hypoxic and placed on oxygen.  She continues to have mild chest discomfort.  Troponin was mildly elevated at 0.39 which decreased to 0.06.  EKG showed poor R wave progression in the anterior leads with no significant ST changes.   Past Medical History:  Diagnosis Date  . Aortic atherosclerosis ([place])   . Bladder cancer [facility])    Dr Bernardo Heater  . Diverticulosis of colon 1996   on sig  . Hyperlipidemia   . Legal blindness   . Macular degeneration    legally blind  . Osteoporosis   . Pain    chronic pain left chest x years. has subclavian block left arm. has never used ntg  . Pulmonary fibrosis, unspecified ([place])   . PVD  (peripheral vascular disease) ([place])   . 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 Unspecified hypothyroidism     Past Surgical History:  Procedure Laterality Date  . BLADDER SURGERY  [id number]   bladder cancer removed- [facility], then [facility]  . CATARACT EXTRACTION    . KYPHOPLASTY N/A 09/18/2016   Procedure: [id number]-V0;  Surgeon: Hessie Knows, MD;  Location: [facility] ORS;  Service: Orthopedics;  Laterality: N/A;  . TONSILLECTOMY       Home Medications:  Prior to Admission medications   Medication Sig Start Date End Date Taking? Authorizing Provider  aspirin 81 MG tablet Take 81 mg by mouth daily.     Yes [provider]  levothyroxine (SYNTHROID, LEVOTHROID) 125 MCG tablet TAKE 1 TABLET BY MOUTH ONCE DAILY BEFOREBREAKFAST 02/23/17  Yes Venia Carbon, MD  omeprazole (PRILOSEC) 20 MG capsule Take 1 capsule (20 mg total) by mouth daily. 09/17/16  Yes Viviana Simpler I, MD  polyethylene glycol powder (GLYCOLAX/MIRALAX) powder TAKE 17GM (LINE INSIDE CAP) IN EIGHT OUNCES OF WATER OR OTHER LIQUID DAILY 11/27/16  Yes Venia Carbon, MD  Probiotic Product (PROBIOTIC PO) Take 1 tablet by mouth daily.   Yes [provider]  acetaminophen (TYLENOL) 500 MG tablet Take 500 mg by mouth every 6 (six) hours as needed.    [provider]  LORazepam (ATIVAN) 0.5 MG tablet TAKE 1/2-1 TABLET BY MOUTH AT BEDTIME ASNEEDED FOR ANXIETY 03/31/17   Venia Carbon, MD  nitroGLYCERIN (NITROSTAT) 0.4 MG SL tablet Place 1 tablet (0.4 mg total) under the tongue every 5 (five) minutes as needed for chest pain. 01/09/15   Venia Carbon, MD  traMADol (ULTRAM) 50 MG tablet TAKE 1 TABLET BY MOUTH 3 TIMES DAILY AS NEEDED 03/31/17   Venia Carbon, MD    Inpatient Medications: Scheduled Meds: . aspirin  300 mg Rectal Daily  . atorvastatin  80 mg Oral q1800  . chlorhexidine  15 mL Mouth Rinse BID  . enoxaparin (LOVENOX) injection  30 mg Subcutaneous  Q24H  . isosorbide mononitrate  30 mg Oral Daily  . levothyroxine  125 mcg Oral QAC breakfast  . mouth rinse  15 mL Mouth Rinse q12n4p  . pantoprazole  40 mg Oral BID  . sodium chloride flush  3 mL Intravenous Q12H   Continuous Infusions: . sodium chloride    . sodium chloride 75 mL/hr at 04/20/17 0600  . ceFEPime (MAXIPIME) IV Stopped (05/01/2017 2200)  . sodium chloride    . vancomycin Stopped (04/29/2017 1930)   PRN Meds: sodium chloride, acetaminophen **OR** acetaminophen, albuterol, alum & mag hydroxide-simeth, bisacodyl, LORazepam, morphine injection, nitroGLYCERIN, ondansetron **OR** ondansetron (ZOFRAN) IV, senna-docusate, sodium chloride flush, traMADol  Allergies:   No Known Allergies  Social History:   Social History   Socioeconomic History  . Marital status: Widowed    Spouse name: Not on file  . Number of children: 0  . Years of education: Not on file  . Highest education level: Not on file  Social Needs  . Financial resource strain: Not on file  . Food insecurity - worry: Not on file  . Food insecurity - inability: Not on file  . Transportation needs - medical: Not on file  . Transportation needs - non-medical: Not on file  Occupational History  . Not on file  Tobacco Use  . Smoking status: Former Smoker    Last attempt to quit: 09/08/1948    Years since quitting: 68.6  . Smokeless tobacco: Never Used  Substance and Sexual Activity  . Alcohol use: No    Alcohol/week: 0.0 oz  . Drug use: No  . Sexual activity: Not on file  Other Topics Concern  . Not on file  Social History Narrative   Widowed 11/08   No children   Doesn't drive   Has living will. Has DNR form already- wants this continued.    Request husbands niece [facility] or [place] as health care POA   Wouldn't want feeding tube    Family History:    Family History  Problem Relation Age of Onset  . Other Mother        enlarged heart  . Diabetes Unknown        strong in family  . Heart  attack Brother 58  . Diabetes Brother   . Cancer Brother        bladder ca  . Hypertension Neg Hx   . Breast cancer Neg Hx   . Colon cancer Neg Hx      ROS:  Please see the history of present illness.  ROS  All other ROS reviewed and negative.     Physical Exam/Data:   Vitals:   04/20/17 0100 04/20/17 0200 04/20/17 0500 04/20/17 0600  BP: 90/72 (!) 133/47 (!) 116/50 (!) 136/59  Pulse: 90 80 83 83  Resp: (!) 27 (!) 23 (!) 30 (!) 26  Temp:  98.7 F (37.1 C)    TempSrc:  Oral  SpO2: 96% 95% 93% 94%  Weight:      Height:        Intake/Output Summary (Last 24 hours) at 04/20/2017 0821 Last data filed at 04/20/2017 0700 Gross per 24 hour  Intake 2593.96 ml  Output 300 ml  Net 2293.96 ml   Filed Weights   04/27/2017 0842 05/03/2017 1025  Weight: 156 lb (70.8 kg) 159 lb 6.3 oz (72.3 kg)   Body mass index is 28.24 kg/m.  General: Frail elderly female in moderate respiratory distress HEENT: normal Lymph: no adenopathy Neck: no JVD Endocrine:  No thryomegaly Vascular: No carotid bruits;   Cardiac:  normal S1, S2; RRR; no murmur  Lungs: Diminished breath sounds bilaterally with extensive bilateral rhonchi.  Accessory muscle use. Abd: soft, nontender, no hepatomegaly  Ext: no edema Musculoskeletal:  No deformities, BUE and BLE strength normal and equal Skin: warm and dry  Neuro:  CNs 2-12 intact, no focal abnormalities noted Psych:  Normal affect   EKG:  The EKG was personally reviewed and demonstrates: Normal sinus rhythm with no significant ST or T wave changes.  Poor R wave progression in the anterior leads. Telemetry:  Telemetry was personally reviewed and demonstrates: Normal sinus rhythm.  Relevant CV Studies: Echocardiogram done on November 21 of 2018:  - Left ventricle: The cavity size was normal. There was mild   concentric hypertrophy. Systolic function was normal. The   estimated ejection fraction was in the range of 60% to 65%. Wall   motion was normal;  there were no regional wall motion   abnormalities. Near cavity obliteration in systole. Doppler   parameters are consistent with abnormal left ventricular   relaxation (grade 1 diastolic dysfunction). - Left atrium: The atrium was normal in size. - Right ventricle: Systolic function was normal. - Pulmonary arteries: Systolic pressure was mildly elevated. PA   peak pressure: 44 mm Hg (S).  Laboratory Data:  Chemistry Recent Labs  Lab 04/20/2017 0815  NA 133*  K 4.1  CL 98*  CO2 23  GLUCOSE 115*  BUN 15  CREATININE 0.86  CALCIUM 8.0*  GFRNONAA 56*  GFRAA >60  ANIONGAP 12    Recent Labs  Lab 04/18/2017 0815  PROT 6.0*  ALBUMIN 2.5*  AST 25  ALT 11*  ALKPHOS 93  BILITOT 0.9   Hematology Recent Labs  Lab 05/02/2017 0815  WBC 16.3*  RBC 4.25  HGB 12.6  HCT 37.7  MCV 88.8  MCH 29.6  MCHC 33.4  RDW 14.4  PLT 210   Cardiac Enzymes Recent Labs  Lab 04/28/2017 0815 04/20/2017 1059  TROPONINI 0.39*  0.06* 0.12*   No results for input(s): TROPIPOC in the last 168 hours.  BNP Recent Labs  Lab 05/02/2017 1059  BNP 234.0*    DDimer No results for input(s): DDIMER in the last 168 hours.  Radiology/Studies:  Dg Chest Port 1 View  Result Date: 04/29/2017 CLINICAL DATA:  Chest pain, shortness of breath and pulmonary fibrosis. EXAM: PORTABLE CHEST 1 VIEW COMPARISON:  04/07/2017 FINDINGS: The heart size and mediastinal contours are within normal limits. Superimposed on severe pulmonary fibrosis are increasing airspace opacities, primarily in both upper lobes but also potentially in the lower lobes. Findings are likely consistent with component of acute pneumonia. No associated pleural effusions. No pneumothorax. The visualized skeletal structures are unremarkable. IMPRESSION: Probable acute pneumonia superimposed on pulmonary fibrosis within both upper lobes but also potentially in the lower lobes bilaterally. Electronically Signed   By: Jenness Corner.D.  On: 05/05/2017  09:06    Assessment and Plan:   1. Atypical chest pain: In spite of prolonged chest pain a few days duration, troponin was only mildly elevated and EKG showed no significant ST or T wave changes.  Recent echocardiogram showed normal LV systolic function and wall motion.  Due to all of that, I suspect that her chest pain is likely noncardiac and most likely GI in nature and could be also due to her lung disease.  She did complain of an exertional component recently and thus underlying coronary artery disease obviously cannot be completely excluded considering her age and comorbidities.  Nonetheless, given her age and comorbidities, she is not a candidate for any further ischemic cardiac evaluation.  I agree with doubling her Protonix.  I also elected to add small dose Imdur.  2. Acute on chronic respiratory failure: Likely due to pulmonary fibrosis.  Her chest x-ray is impressive and I suspect that this is the primary issue here.  Overall prognosis is poor.  I discussed with patient and her niece and they both agree on no heroic measures.  Palliative care consult is appropriate of her lung disease is not reversible. We will see as needed.  Please call us with questions.   For questions or updates, please contact [place] Please consult [email or web address] for contact info under Cardiology/STEMI.   Signed, Kathlyn Sacramento, MD  04/20/2017 8:21 AM

## 2017-04-20 NOTE — Progress Notes (Signed)
[place] NP notified of decreased urine output. Patient has a purewick on. Output for shift equals 125 ml so far. Will continue to monitor.

## 2017-04-20 NOTE — Progress Notes (Signed)
Since the patient is not thought to have a cardiac event by cardiology, I D/Cd the echocardiogram order as one had been recently been done last month.

## 2017-04-20 NOTE — Progress Notes (Signed)
Spoke with Burman Nieves NP about patient increased work of breathing despite Fentanyl PRN orders (seeEMAR) Patient restless, napping at bed covers, having accessory muscle use with breathing and tachypnea, 30-40 breaths per minute. Received new orders from [facility] NP (see EMAR).

## 2017-04-20 NOTE — Progress Notes (Signed)
Dr. Humphrey Rolls gave order to give 20 mg IV lasix once.

## 2017-04-20 NOTE — Progress Notes (Signed)
[facility] Consultation     Date: 04/20/2017,   MRN# [id number] Tracy Matthews [birth year] Code Status:     Code Status Orders  (From admission, onward)        Start     Ordered   04/30/2017 1023  Do not attempt resuscitation (DNR)  Continuous    Question Answer Comment  In the event of cardiac or respiratory ARREST Do not call a "code blue"   In the event of cardiac or respiratory ARREST Do not perform Intubation, CPR, defibrillation or ACLS   In the event of cardiac or respiratory ARREST Use medication by any route, position, wound care, and other measures to relive pain and suffering. May use oxygen, suction and manual treatment of airway obstruction as needed for comfort.      05/11/2017 1022        AdmissionWeight: 156 lb (70.8 kg)                 CurrentWeight: 159 lb 6.3 oz (72.3 kg) Tracy Matthews is a 83 y.o. old female     SUBJECTIVE:   No significant change.  Still endorses occasional chest pain.  Had mild shortness of breath this morning. Gets uncomfortable wearing BiPAP.  Is on heated high flow.  Sats were 88% on 80% heated high flow this morning.  Patient refused BiPAP.  Heated high flow was increased 200% with improvement in saturations. ignificant improvement.  Still has occasional chest pain.  Endorsed mild shortness of breath this morning  MEDIATIONS    Home Medication:    Current Medication:   Current Facility-Administered Medications:  .  0.9 %  sodium chloride infusion, 250 mL, Intravenous, PRN, Demetrios Loll, MD .  0.9 %  sodium chloride infusion, , Intravenous, Continuous, Nettie Elm, MD, Last Rate: 75 mL/hr at 04/20/17 0600 .  acetaminophen (TYLENOL) tablet 650 mg, 650 mg, Oral, Q6H PRN **OR** acetaminophen (TYLENOL) suppository 650 mg, 650 mg, Rectal, Q6H PRN, Demetrios Loll, MD .  albuterol (PROVENTIL) (2.5 MG/3ML) 0.083% nebulizer solution 2.5 mg, 2.5 mg, Nebulization, Q2H PRN, Demetrios Loll, MD .  alum & mag hydroxide-simeth  (MAALOX/MYLANTA) 200-200-20 MG/5ML suspension 15 mL, 15 mL, Oral, Q4H PRN, Nettie Elm, MD .  aspirin suppository 300 mg, 300 mg, Rectal, Daily, Jonathon Bellows, MD, 300 mg at 04/18/2017 1550 .  atorvastatin (LIPITOR) tablet 80 mg, 80 mg, Oral, q1800, Nettie Elm, MD .  bisacodyl (DULCOLAX) EC tablet 5 mg, 5 mg, Oral, Daily PRN, Demetrios Loll, MD .  ceFEPIme (MAXIPIME) 2 g in dextrose 5 % 50 mL IVPB, 2 g, Intravenous, Q12H, Ramond Dial, RPH, Stopped at 04/20/17 1018 .  chlorhexidine (PERIDEX) 0.12 % solution 15 mL, 15 mL, Mouth Rinse, BID, Nettie Elm, MD, 15 mL at 04/20/17 0921 .  enoxaparin (LOVENOX) injection 40 mg, 40 mg, Subcutaneous, Q24H, Arida, Muhammad A, MD, 40 mg at 04/20/17 0954 .  fentaNYL (SUBLIMAZE) injection 50 mcg, 50 mcg, Intravenous, Q2H PRN, Nettie Elm, MD .  furosemide (LASIX) 10 MG/ML injection, , , ,  .  furosemide (LASIX) injection 20 mg, 20 mg, Intravenous, Once, Nettie Elm, MD .  isosorbide mononitrate (IMDUR) 24 hr tablet 30 mg, 30 mg, Oral, Daily, Arida, Muhammad A, MD .  levothyroxine (SYNTHROID, LEVOTHROID) injection 62.5 mcg, 62.5 mcg, Intravenous, Daily, Nettie Elm, MD .  LORazepam (ATIVAN) tablet 0.5 mg, 0.5 mg, Oral, QHS PRN, Demetrios Loll, MD .  MEDLINE mouth rinse, 15 mL, Mouth Rinse, q12n4p, Nettie Elm, MD .  nitroGLYCERIN (NITROSTAT) SL tablet 0.4 mg, 0.4 mg, Sublingual, Q5 min PRN, Demetrios Loll, MD .  ondansetron [facility]) tablet 4 mg, 4 mg, Oral, Q6H PRN **OR** ondansetron (ZOFRAN) injection 4 mg, 4 mg, Intravenous, Q6H PRN, Demetrios Loll, MD .  pantoprazole (PROTONIX) injection 40 mg, 40 mg, Intravenous, Q12H, Nettie Elm, MD .  senna-docusate (Senokot-S) tablet 1 tablet, 1 tablet, Oral, QHS PRN, Demetrios Loll, MD .  sodium chloride 0.9 % bolus 500 mL, 500 mL, Intravenous, Once, Nettie Elm, MD .  sodium chloride flush (NS) 0.9 % injection 3 mL, 3 mL, Intravenous, Q12H, Demetrios Loll, MD, 3 mL at 04/20/17 0954 .  sodium chloride flush (NS) 0.9 % injection 3 mL,  3 mL, Intravenous, PRN, Demetrios Loll, MD .  traMADol Veatrice Bourbon) tablet 50 mg, 50 mg, Oral, TID PRN, Demetrios Loll, MD    VS: BP (!) 128/51   Pulse (!) 103   Temp 98.5 F (36.9 C) (Axillary)   Resp (!) 25   Ht 5\' 3"  (1.6 m)   Wt 159 lb 6.3 oz (72.3 kg)   SpO2 92%   BMI 28.24 kg/m      PHYSICAL EXAM   Physical Exam  Awake, mild shortness of breath. Following commands. CVS S1, S2, 0. Chest examination no rales rhonchi or wheezes.  Occasional bilateral crackles especially at the bases. Abdomen is soft, nontender, no distended.  Bowel sounds are positive. No lower extremity edema.   LABS    Recent Labs    04/18/2017 0815 04/20/17 0849  HGB 12.6 11.5*  HCT 37.7 35.5  MCV 88.8 90.3  WBC 16.3* 20.3*  BUN 15 14  CREATININE 0.86 0.75  GLUCOSE 115* 86  CALCIUM 8.0* 8.2*  INR 1.13  --   ,    No results for input(s): PH in the last 72 hours.  Invalid input(s): PCO2, PO2, BASEEXCESS, BASEDEFICITE, TFT    CULTURE RESULTS   Recent Results (from the past 240 hour(s))  Culture, blood (routine x 2)     Status: None (Preliminary result)   Collection Time: 04/27/2017  8:21 AM  Result Value Ref Range Status   Specimen Description BLOOD RIGHT HAND  Final   Special Requests   Final    BOTTLES DRAWN AEROBIC AND ANAEROBIC Blood Culture adequate volume   Culture NO GROWTH < 24 HOURS  Final   Report Status PENDING  Incomplete  Culture, blood (routine x 2)     Status: None (Preliminary result)   Collection Time: 04/23/2017  8:21 AM  Result Value Ref Range Status   Specimen Description BLOOD RIGHT ASSIST CONTROL  Final   Special Requests   Final    BOTTLES DRAWN AEROBIC AND ANAEROBIC Blood Culture adequate volume   Culture NO GROWTH < 24 HOURS  Final   Report Status PENDING  Incomplete  MRSA PCR Screening     Status: None   Collection Time: 05/03/2017 10:24 AM  Result Value Ref Range Status   MRSA by PCR NEGATIVE NEGATIVE Final    Comment:        The GeneXpert MRSA Assay  (FDA approved for NASAL specimens only), is one component of a comprehensive MRSA colonization surveillance program. It is not intended to diagnose MRSA infection nor to guide or monitor treatment for MRSA infections.           IMAGING    No results found.       ASSESSMENT/PLAN     83 years old lady with a past medical history  significant for chronic hypoxemic respiratory failure-on 2 L of oxygen, pulmonary fibrosis, dyslipidemia, peripheral vascular disease, history of chest pain who was recently discharged from the hospital in November after an inpatient stay for chest pain.  At that time workup including echocardiogram was performed and she was seen by cardiology.  Echocardiogram performed on 11/18 showed ejection fraction of 60-65% with near cavity obliteration in systole.  Cardiology recommended outpatient stress test.  Admitted to the hospital with the following:  Acute on chronic hypoxemic respiratory failure - on 2 L O2 at home Pneumonia Sepsis Lactic acidosis - resolved Elevated troponins-?  Non-ST elevation MI vs demand ischemia Dyslipidemia Pulmonary fibrosis Peripheral vascular disease  Plan: Patient remains on heated high flow. In mild respiratory distress this morning.  O2 saturations borderline at 88% on 80% heated high flow.  Patient refused BiPAP as he gets extremely uncomfortable on it.  He did high flow increased to 100% with improvement in saturations. Continue cefepime. MRSA screen has been negative.  Blood cultures have been negative.  DC vancomycin. Continue as needed bronchodilators. Follow cultures  Blood pressure had been okay until this morning when the patient dropped her systolic blood pressure after getting morphine.  Patient's mean arterial pressures remained above 65. The patient remained asymptomatic. Morphine stopped.  Pain medications changed to as needed fentanyl. Continue normal saline at 75 mL's per hour. If she becomes  hypotensive and consistently drops mean arterial pressure less than 65, we will start vasopressors Gentle hydration given the patient's CODE STATUS of DNR/DNI   Appreciate cardiology's input.  They feel that the chest pain is likely noncardiac and most likely GI in nature. We will touch this with cardiologist to see if the patient still needs to be on heparin drip.  Continue aspirin and statins. Check echocardiogram.  ?  Cause of chest pain.  Per cardiology evaluation, thought to be less likely from a cardiac etiology. ?  GI in origin. Increase Protonix to twice daily. Start as needed Maalox.  Patient has difficulty swallowing. We will change Synthroid to IV  Protonix for stress ulcer prophylaxis Systemic heparin as above  CODE STATUS DNR/DNI.  Discussed at length with the patient's niece was present in the room.  Patient's niece stated that she and her sister are the closest relatives as the patient does not have any children. We discussed the patient's current condition as well as her baseline status. Given the patient's decline in functionality and overall comorbidities, we discussed plans going forward that include continue current care or considering changing goals of care to comfort care and looking at hospice care. The patient's niece was interested in meeting palliative care service to explore this option further.  She states that her sister is the healthcare power of attorney.  I have asked her to get her sister involved in this discussion with palliative care as well.  We will consult palliative care services for assistance in decision making as well as talking to the family about hospice care if the family were to choose to go down that route.   Total critical care time spent was 45 minutes.      Nettie Elm, M.D.  Pulmonary & Critical Care Medicine

## 2017-04-21 DIAGNOSIS — J9621 Acute and chronic respiratory failure with hypoxia: Secondary | ICD-10-CM

## 2017-04-21 DIAGNOSIS — Z7189 Other specified counseling: Secondary | ICD-10-CM

## 2017-04-21 DIAGNOSIS — Z515 Encounter for palliative care: Secondary | ICD-10-CM

## 2017-04-21 DIAGNOSIS — R0602 Shortness of breath: Secondary | ICD-10-CM

## 2017-04-21 DIAGNOSIS — Z66 Do not resuscitate: Secondary | ICD-10-CM

## 2017-04-21 MED ORDER — MORPHINE SULFATE (PF) 2 MG/ML IV SOLN: 2.0000 mg | INTRAVENOUS | Status: DC | PRN

## 2017-04-21 MED ORDER — FUROSEMIDE 10 MG/ML IJ SOLN
40.0000 mg | Freq: Once | INTRAMUSCULAR | Status: AC
  Administered 2017-04-21: 40 mg via INTRAVENOUS
  Filled 2017-04-21: qty 4

## 2017-04-21 MED ORDER — SODIUM CHLORIDE 0.9 % IV SOLN
1.0000 mg/h | INTRAVENOUS | Status: DC
  Administered 2017-04-21: 1 mg/h via INTRAVENOUS
  Filled 2017-04-21: qty 10

## 2017-04-24 LAB — CULTURE, BLOOD (ROUTINE X 2)
CULTURE: NO GROWTH
Culture: NO GROWTH
SPECIAL REQUESTS: ADEQUATE
SPECIAL REQUESTS: ADEQUATE

## 2017-04-28 LAB — BLOOD GAS, VENOUS
Acid-base deficit: 0.1 mmol/L (ref 0.0–2.0)
Bicarbonate: 25.4 mmol/L (ref 20.0–28.0)
O2 Saturation: 66.9 %
PATIENT TEMPERATURE: 37
PCO2 VEN: 44 mmHg (ref 44.0–60.0)
PH VEN: 7.37 (ref 7.250–7.430)
PO2 VEN: 36 mmHg (ref 32.0–45.0)

## 2017-05-15 ENCOUNTER — Ambulatory Visit: Payer: Medicare Other | Admitting: Internal Medicine

## 2017-05-19 NOTE — Progress Notes (Signed)
Patient transferred by bed to room 122.  Taking shallow breaths.  Niece at bedside and Andee Poles, Therapist, sports.

## 2017-05-19 NOTE — Progress Notes (Signed)
Report called to Andee Poles, RN on 1C.  Patient will be moved to room 122.

## 2017-05-19 NOTE — Care Management (Signed)
Patient is now under a comfort plan of care.  Notified [employer]

## 2017-05-19 NOTE — Discharge Summary (Signed)
   [facility] at [place] NAME: Tracy Matthews    MR#:  [id number]  DATE OF BIRTH:  1922/02/24  DATE OF ADMISSION:  05/17/2017   ADMITTING PHYSICIAN: Demetrios Loll, MD  DATE OF DISCHARGE: May 01, 2017  7:57 PM  PRIMARY CARE PHYSICIAN: Venia Carbon, MD   ADMISSION DIAGNOSIS:  SOB (shortness of breath) [R06.02] Acute respiratory failure, unspecified whether with hypoxia or hypercapnia (HCC) [J96.00] DISCHARGE DIAGNOSIS:  Active Problems:   Acute on chronic respiratory failure with hypoxia ([place])   Palliative care by specialist   DNR (do not resuscitate)   SOB (shortness of breath)  SECONDARY DIAGNOSIS:   Past Medical History:  Diagnosis Date  . Aortic atherosclerosis ([place])   . Bladder cancer [facility])    Dr Bernardo Heater  . Diverticulosis of colon 1996   on sig  . Hyperlipidemia   . Legal blindness   . Macular degeneration    legally blind  . Osteoporosis   . Pain    chronic pain left chest x years. has subclavian block left arm. has never used ntg  . Pulmonary fibrosis, unspecified ([place])   . PVD (peripheral vascular disease) ([place])   . 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 Unspecified hypothyroidism    HOSPITAL COURSE:   Acute on chronic respiratory failure with hypoxiadue to PNA andPulmonary fibrosis.  She was on HF, off BiPAP,NEB  Sepsis (hypotension,tachypnea, leukocytosis) due to pneumonia, HAP She was on cefepime and discontinued vancomycin.  Septic shock. Due to above. Improved with normal saline IV bolus..  Lacticacidosis. improved. Hyponatremia. improved.  Elevated troponin, due to demanding ischemia as above.  She was treated with aspirin and heparin drip.  Chronic chest pain. Hold tramadol at this time due to hypotension. PVD. shewas on aspirin. She was critical and had very poor prognosis, transitioned to comfort care.  She died at 19:58.  Demetrios Loll M.D on 04/22/2017  at 4:15 PM  Between 7am to 6pm - Pager - [phone]  After 6pm go to [email or web address] - Proofreader  [facility]  Office  [phone]  CC: Primary care physician; Venia Carbon, MD   Note: This dictation was prepared with Dragon dictation along with smaller phrase technology. Any transcriptional errors that result from this process are unintentional.

## 2017-05-19 NOTE — Progress Notes (Addendum)
CH responded to a PG to provide presence with Pt niece. Niece was not available at this visit. [place] will follow up later in rounding. Time: 11:05 AM    April 27, 2017 1100  Clinical Encounter Type  Visited With Patient;Health care provider  Visit Type Follow-up;Spiritual support  Referral From Nurse  Consult/Referral To Chaplain

## 2017-05-19 NOTE — Progress Notes (Signed)
Morphine drip started per order.  Patient resting comfortably at this time with no acute distress.  Niece Jeani Hawking at bedside.

## 2017-05-19 NOTE — Clinical Social Work Note (Signed)
CSW notified patient is now comfort care.  Shela Leff MSW,LCSW [phone]

## 2017-05-19 NOTE — Progress Notes (Signed)
Notified ICU, NP of left sided facial droop. Discussed IVP morphine recently given and advised to repeat neurological assessment when the patient awakes.  No new orders obtained at this time.

## 2017-05-19 NOTE — Progress Notes (Signed)
Patient transferred from CCU to 122 on comfort care.  Morphine drip infusing at 1mg /hr.  Patient resting comfortably in bed on 6L o2 via [place] in no distress.  Niece at bedside.  Bereavement cart ordered for family. Family educated on use call bell for assistance.  Clarise Cruz, RN

## 2017-05-19 NOTE — Progress Notes (Signed)
RN spoke with Dr. Mortimer Fries and MD gave order to transfer patient to 1C and gave order that RN may pronounce death.

## 2017-05-19 NOTE — Progress Notes (Signed)
Patient appears moderately dyspneic Unresponsive, not following commands New finding of left hemiplegia   Vitals:   2017-05-19 0000 05/19/17 0400 2017-05-19 0800 05/19/17 0828  BP: 106/74 (!) 109/51 (!) 155/60   Pulse: 95 60    Resp: (!) 27 (!) 26 (!) 25   Temp: 98.2 F (36.8 C) 97.8 F (36.6 C)  97.6 F (36.4 C)  TempSrc: Axillary Axillary  Axillary  SpO2: 92% 100% 97%   Weight:      Height:      on HFNC @ 100%   Increased WOB Unresponsive Pupils reactive Bilateral crackles Regular with frequent extrasystoles Soft, diminished bowel sounds Extremities cool, no edema Left hemiplegia (new) Left Babinski present  BMP Latest Ref Rng & Units 04/20/2017 05/05/2017 04/07/2017  Glucose 65 - 99 mg/dL 86 115(H) 113(H)  BUN 6 - 20 mg/dL 14 15 26(H)  Creatinine 0.44 - 1.00 mg/dL 0.75 0.86 1.02(H)  Sodium 135 - 145 mmol/L 137 133(L) 135  Potassium 3.5 - 5.1 mmol/L 4.3 4.1 4.5  Chloride 101 - 111 mmol/L 106 98(L) 104  CO2 22 - 32 mmol/L 19(L) 23 20(L)  Calcium 8.9 - 10.3 mg/dL 8.2(L) 8.0(L) 8.7(L)    CBC Latest Ref Rng & Units 04/20/2017 04/27/2017 04/07/2017  WBC 3.6 - 11.0 K/uL 20.3(H) 16.3(H) 7.8  Hemoglobin 12.0 - 16.0 g/dL 11.5(L) 12.6 12.9  Hematocrit 35.0 - 47.0 % 35.5 37.7 38.5  Platelets 150 - 440 K/uL 246 210 196    IMPRESSION: Very advanced age DNR Severe respiratory distress Severe acute on chronic hypoxemic respiratory failure Underlying pulmonary fibrosis Pulmonary edema pattern chest x-ray Suspected pneumonia Elevated troponin Left hemiplegia - suspected acute right hemispheric CVA  PLAN/REC: I spoke with the patient's niece who is also HC POA We agree that her level of discomfort is unacceptable We will proceed with morphine infusion and comfort measures only  Merton Border, MD PCCM service Mobile [phone] Pager [phone] 05-19-2017 12:23 PM

## 2017-05-19 NOTE — Progress Notes (Signed)
Notified Maggie NP of decreased movement of left side in arm and leg. Maggie NP aware and acknowledged, no new orders received at this time. Will continue to reassess.

## 2017-05-19 NOTE — Progress Notes (Signed)
Pt expired at [place].  Confirmed by this RN and Magdalen Spatz.  Nursing supervisor contacted and she called [facility] and made arrangements with family for funeral home. Dorna Bloom RN

## 2017-05-19 NOTE — Progress Notes (Signed)
Dr. Alva Garnet present at bedside talking with patient's niece and gave RN order for 40 mg IV lasix once.

## 2017-05-19 NOTE — Progress Notes (Signed)
Tracy Matthews responded to an OR for End of Life. Pt appeared resting. Pt has transitioned to comfort care. No family was bedside on this visit. CH offered prayer at bedside and will follow up later in rounding.    05-14-2017 1000  Clinical Encounter Type  Visited With Patient  Visit Type Initial;Spiritual support;Critical Care;Patient actively dying  Referral From Nurse  Consult/Referral To Chaplain  Spiritual Encounters  Spiritual Needs Prayer

## 2017-05-19 NOTE — Progress Notes (Signed)
   [facility] at [place] NAME: Tracy Matthews    MR#:  [id number]  DATE OF BIRTH:  04/22/1922  SUBJECTIVE:  CHIEF COMPLAINT:   Chief Complaint  Patient presents with  . Chest Pain   She is unresponsive, on HF. REVIEW OF SYSTEMS:  Review of Systems  Unable to perform ROS: Critical illness    DRUG ALLERGIES:  No Known Allergies VITALS:  Blood pressure (!) 155/60, pulse 60, temperature 97.6 F (36.4 C), temperature source Axillary, resp. rate (!) 25, height 5\' 3"  (1.6 m), weight 159 lb 6.3 oz (72.3 kg), SpO2 97 %. PHYSICAL EXAMINATION:  Physical Exam  Constitutional:  Looks critically ill, unresponsive.  HENT:  Head: Normocephalic.  Pulmonary/Chest: No respiratory distress.  Bilateral crackles.  Abdominal: Soft. Bowel sounds are normal.  Neurological:  unresponsive   LABORATORY PANEL:  Female CBC Recent Labs  Lab 04/20/17 0849  WBC 20.3*  HGB 11.5*  HCT 35.5  PLT 246   ------------------------------------------------------------------------------------------------------------------ Chemistries  Recent Labs  Lab 04/28/2017 0815 04/20/2017 2150 04/20/17 0849  NA 133*  --  137  K 4.1  --  4.3  CL 98*  --  106  CO2 23  --  19*  GLUCOSE 115*  --  86  BUN 15  --  14  CREATININE 0.86  --  0.75  CALCIUM 8.0*  --  8.2*  MG  --  2.5*  --   AST 25  --   --   ALT 11*  --   --   ALKPHOS 93  --   --   BILITOT 0.9  --   --    RADIOLOGY:  No results found. ASSESSMENT AND PLAN:   Acute on chronic respiratory failure with hypoxia due to PNA and Pulmonary fibrosis.  on HF, off BiPAP, NEB  Sepsis (hypotension, tachypnea, leukocytosis) due to pneumonia, HAP She was on cefepime and discontinue vancomycin.  Septic shock.  Due to above. Improved with normal saline IV bolus..  Lactic acidosis. improved. Hyponatremia.  improved.  Elevated troponin, due to demanding ischemia as above.   She was treated with aspirin and  heparin drip.  Chronic chest pain.  Hold tramadol at this time due to hypotension. PVD. she was on aspirin.  She is critical and has very poor prognosis, transitioned to comfort care now.  She may die soon. All the records are reviewed and case discussed with Care Management/Social Worker. Management plans discussed with the patient, her niece (POA) and they are in agreement.  CODE STATUS: DNR  TOTAL TIME TAKING CARE OF THIS PATIENT: 25 minutes.   More than 50% of the time was spent in counseling/coordination of care: YES  POSSIBLE D/C IN ? DAYS, DEPENDING ON CLINICAL CONDITION.   Demetrios Loll M.D on 04-22-17 at 3:21 PM  Between 7am to 6pm - Pager - [phone]  After 6pm go to [email or web address] - [facility]

## 2017-05-19 DEATH — deceased

## 2017-07-02 ENCOUNTER — Ambulatory Visit: Payer: Medicare Other | Admitting: Podiatry

## 2018-03-03 ENCOUNTER — Encounter: Payer: Medicare Other | Admitting: Internal Medicine
